# Patient Record
Sex: Female | Born: 1940 | Race: White | Hispanic: No | Smoking: Former smoker
Health system: Southern US, Community
[De-identification: ages and names within clinical notes are randomized; demographics above are authoritative.]

## PROBLEM LIST (undated history)

## (undated) DIAGNOSIS — C801 Malignant (primary) neoplasm, unspecified: Secondary | ICD-10-CM

## (undated) DIAGNOSIS — G894 Chronic pain syndrome: Secondary | ICD-10-CM

## (undated) DIAGNOSIS — E039 Hypothyroidism, unspecified: Secondary | ICD-10-CM

## (undated) DIAGNOSIS — M797 Fibromyalgia: Secondary | ICD-10-CM

## (undated) DIAGNOSIS — K219 Gastro-esophageal reflux disease without esophagitis: Secondary | ICD-10-CM

## (undated) DIAGNOSIS — G510 Bell's palsy: Secondary | ICD-10-CM

## (undated) DIAGNOSIS — Z8719 Personal history of other diseases of the digestive system: Secondary | ICD-10-CM

## (undated) DIAGNOSIS — E785 Hyperlipidemia, unspecified: Secondary | ICD-10-CM

## (undated) DIAGNOSIS — R011 Cardiac murmur, unspecified: Secondary | ICD-10-CM

## (undated) HISTORY — DX: Hypothyroidism, unspecified: E03.9

## (undated) HISTORY — PX: DILATION AND CURETTAGE OF UTERUS: SHX78

## (undated) HISTORY — PX: APPENDECTOMY: SHX54

## (undated) HISTORY — DX: Bell's palsy: G51.0

## (undated) HISTORY — DX: Chronic pain syndrome: G89.4

## (undated) HISTORY — PX: TONSILLECTOMY: SUR1361

## (undated) HISTORY — PX: OTHER SURGICAL HISTORY: SHX169

## (undated) HISTORY — DX: Hyperlipidemia, unspecified: E78.5

## (undated) HISTORY — DX: Fibromyalgia: M79.7

## (undated) HISTORY — PX: CHOLECYSTECTOMY: SHX55

---

## 2018-12-15 ENCOUNTER — Other Ambulatory Visit: Payer: Self-pay | Admitting: Internal Medicine

## 2018-12-15 ENCOUNTER — Other Ambulatory Visit: Payer: Self-pay

## 2018-12-15 DIAGNOSIS — Z20822 Contact with and (suspected) exposure to covid-19: Secondary | ICD-10-CM

## 2018-12-18 LAB — NOVEL CORONAVIRUS, NAA: SARS-CoV-2, NAA: DETECTED — AB

## 2019-01-04 ENCOUNTER — Other Ambulatory Visit: Payer: Self-pay

## 2019-01-04 ENCOUNTER — Other Ambulatory Visit: Payer: Medicare HMO

## 2019-01-04 DIAGNOSIS — Z20822 Contact with and (suspected) exposure to covid-19: Secondary | ICD-10-CM

## 2019-01-08 LAB — NOVEL CORONAVIRUS, NAA: SARS-CoV-2, NAA: NOT DETECTED

## 2019-12-13 ENCOUNTER — Other Ambulatory Visit (HOSPITAL_COMMUNITY): Payer: Self-pay | Admitting: Adult Health

## 2019-12-13 DIAGNOSIS — Z780 Asymptomatic menopausal state: Secondary | ICD-10-CM

## 2020-01-03 ENCOUNTER — Ambulatory Visit (HOSPITAL_COMMUNITY)
Admission: RE | Admit: 2020-01-03 | Discharge: 2020-01-03 | Disposition: A | Discharge status: Home / Self Care | Payer: Medicare HMO | Source: Ambulatory Visit | Attending: Adult Health | Admitting: Adult Health

## 2020-01-03 ENCOUNTER — Other Ambulatory Visit: Payer: Self-pay

## 2020-01-03 DIAGNOSIS — Z780 Asymptomatic menopausal state: Secondary | ICD-10-CM | POA: Diagnosis not present

## 2022-10-13 NOTE — Progress Notes (Unsigned)
GI Office Note    Referring Provider: Katherine Basset* Primary Care Physician:  Kara Pacer, NP  Primary Gastroenterologist:  Chief Complaint   No chief complaint on file.    History of Present Illness   Peggy Graves is a 82 y.o. female presenting today for dysphagia at the request of ***   Labs *** in epic and media Fibromyalgia,  Bell's palsy,  Hypothyroidism Chronic pain     Medications   No current outpatient medications on file.   No current facility-administered medications for this visit.    Allergies   Allergies as of 10/14/2022   (Not on File)    Past Medical History   No past medical history on file.  Past Surgical History   *** The histories are not reviewed yet. Please review them in the "History" navigator section and refresh this SmartLink.  Past Family History   No family history on file.  Past Social History   Social History   Socioeconomic History   Marital status: Divorced    Spouse name: Not on file   Number of children: Not on file   Years of education: Not on file   Highest education level: Not on file  Occupational History   Not on file  Tobacco Use   Smoking status: Not on file   Smokeless tobacco: Not on file  Substance and Sexual Activity   Alcohol use: Not on file   Drug use: Not on file   Sexual activity: Not on file  Other Topics Concern   Not on file  Social History Narrative   Not on file   Social Determinants of Health   Financial Resource Strain: Not on file  Food Insecurity: Not on file  Transportation Needs: Not on file  Physical Activity: Not on file  Stress: Not on file  Social Connections: Not on file  Intimate Partner Violence: Not on file    Review of Systems   General: Negative for anorexia, weight loss, fever, chills, fatigue, weakness. Eyes: Negative for vision changes.  ENT: Negative for hoarseness, difficulty swallowing , nasal congestion. CV:  Negative for chest pain, angina, palpitations, dyspnea on exertion, peripheral edema.  Respiratory: Negative for dyspnea at rest, dyspnea on exertion, cough, sputum, wheezing.  GI: See history of present illness. GU:  Negative for dysuria, hematuria, urinary incontinence, urinary frequency, nocturnal urination.  MS: Negative for joint pain, low back pain.  Derm: Negative for rash or itching.  Neuro: Negative for weakness, abnormal sensation, seizure, frequent headaches, memory loss,  confusion.  Psych: Negative for anxiety, depression, suicidal ideation, hallucinations.  Endo: Negative for unusual weight change.  Heme: Negative for bruising or bleeding. Allergy: Negative for rash or hives.  Physical Exam   There were no vitals taken for this visit.   General: Well-nourished, well-developed in no acute distress.  Head: Normocephalic, atraumatic.   Eyes: Conjunctiva pink, no icterus. Mouth: Oropharyngeal mucosa moist and pink , no lesions erythema or exudate. Neck: Supple without thyromegaly, masses, or lymphadenopathy.  Lungs: Clear to auscultation bilaterally.  Heart: Regular rate and rhythm, no murmurs rubs or gallops.  Abdomen: Bowel sounds are normal, nontender, nondistended, no hepatosplenomegaly or masses,  no abdominal bruits or hernia, no rebound or guarding.   Rectal: *** Extremities: No lower extremity edema. No clubbing or deformities.  Neuro: Alert and oriented x 4 , grossly normal neurologically.  Skin: Warm and dry, no rash or jaundice.   Psych: Alert and cooperative, normal mood  and affect.  Labs   *** Imaging Studies   No results found.  Assessment       PLAN   ***   Leanna Battles. Melvyn Neth, MHS, PA-C [facility]

## 2022-10-14 ENCOUNTER — Encounter: Payer: Self-pay | Admitting: *Deleted

## 2022-10-14 ENCOUNTER — Telehealth: Payer: Self-pay | Admitting: *Deleted

## 2022-10-14 ENCOUNTER — Ambulatory Visit: Payer: Medicare PPO | Admitting: Gastroenterology

## 2022-10-14 ENCOUNTER — Encounter: Payer: Self-pay | Admitting: Gastroenterology

## 2022-10-14 VITALS — BP 167/65 | HR 73 | Temp 98.2°F | Ht 62.0 in | Wt 166.4 lb

## 2022-10-14 DIAGNOSIS — R1310 Dysphagia, unspecified: Secondary | ICD-10-CM | POA: Insufficient documentation

## 2022-10-14 DIAGNOSIS — R490 Dysphonia: Secondary | ICD-10-CM

## 2022-10-14 DIAGNOSIS — R1319 Other dysphagia: Secondary | ICD-10-CM

## 2022-10-14 NOTE — Telephone Encounter (Signed)
[employer] PA: Approved Authorization #[id number]  Tracking #[id number] Dates of service 11/13/2022 - 02/12/2023

## 2022-10-14 NOTE — Patient Instructions (Signed)
Upper endoscopy to be scheduled to evaluate your esophagus. 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  Please follow up with your PCP regarding elevated blood pressure above 140/90.

## 2022-10-15 ENCOUNTER — Other Ambulatory Visit: Payer: Self-pay | Admitting: *Deleted

## 2022-10-15 ENCOUNTER — Encounter: Payer: Self-pay | Admitting: Gastroenterology

## 2022-10-15 ENCOUNTER — Telehealth: Payer: Self-pay | Admitting: Gastroenterology

## 2022-10-15 DIAGNOSIS — R490 Dysphonia: Secondary | ICD-10-CM

## 2022-10-15 NOTE — Telephone Encounter (Signed)
I faxed the referral along with notes from [facility].  I had to put the referral in epic to show she was referred.

## 2022-10-15 NOTE — Telephone Encounter (Signed)
Tammy, can you make sure that ENT is aware that the reason for referral is not just hoarseness, she also has an abnormal right vocal cord seen on CT that needs direct visualization. It was not clear of this on the referral order. Thanks!

## 2022-11-11 ENCOUNTER — Other Ambulatory Visit: Payer: Self-pay

## 2022-11-11 ENCOUNTER — Encounter (HOSPITAL_COMMUNITY)
Admission: RE | Admit: 2022-11-11 | Discharge: 2022-11-11 | Disposition: A | Discharge status: Home / Self Care | Payer: Medicare PPO | Source: Ambulatory Visit | Attending: Internal Medicine | Admitting: Internal Medicine

## 2022-11-11 ENCOUNTER — Encounter (HOSPITAL_COMMUNITY): Payer: Self-pay

## 2022-11-11 DIAGNOSIS — R9431 Abnormal electrocardiogram [ECG] [EKG]: Secondary | ICD-10-CM | POA: Diagnosis not present

## 2022-11-11 DIAGNOSIS — I517 Cardiomegaly: Secondary | ICD-10-CM | POA: Insufficient documentation

## 2022-11-11 DIAGNOSIS — Z01810 Encounter for preprocedural cardiovascular examination: Secondary | ICD-10-CM | POA: Insufficient documentation

## 2022-11-11 HISTORY — DX: Cardiac murmur, unspecified: R01.1

## 2022-11-11 LAB — NO BLOOD PRODUCTS

## 2022-11-11 NOTE — Patient Instructions (Addendum)
Peggy Graves  11/11/2022     @PREFPERIOPPHARMACY @   Your procedure is scheduled on 11/13/2022.  Report to [facility] at 10:30 [facility].M.  Call this number if you have problems the morning of surgery:  [phone]  If you experience any cold or flu symptoms such as cough, fever, chills, shortness of breath, etc. between now and your scheduled surgery, please notify us at the above number.   Remember:   Please Follow the diet and prep instructions given to you by Dr Luvenia Starch office.      Take these medicines the morning of surgery with A SIP OF WATER : Hydrocodone Levothyroxine Meloxicam Omeprazole Ditropan  Do not wear jewelry, make-up or nail polish.  Do not wear lotions, powders, or perfumes, or deodorant.  Do not shave 48 hours prior to surgery.  Men may shave face and neck.  Do not bring valuables to the hospital.  [facility] is not responsible for any belongings or valuables.  Contacts, dentures or bridgework may not be worn into surgery.  Leave your suitcase in the car.  After surgery it may be brought to your room.  For patients admitted to the hospital, discharge time will be determined by your treatment team.  Patients discharged the day of surgery will not be allowed to drive home.   Name and phone number of your driver:   Family Special instructions:  N/A  Please read over the following fact sheets that you were given. Care and Recovery After Surgery  Upper Endoscopy, Adult Upper endoscopy is a procedure to look inside the upper GI (gastrointestinal) tract. The upper GI tract is made up of: The esophagus. This is the part of the body that moves food from your mouth to your stomach. The stomach. The duodenum. This is the first part of your small intestine. This procedure is also called esophagogastroduodenoscopy (EGD) or gastroscopy. In this procedure, your health care provider passes a thin, flexible tube (endoscope) through your mouth and down your esophagus into  your stomach and into your duodenum. A small camera is attached to the end of the tube. Images from the camera appear on a monitor in the exam room. During this procedure, your health care provider may also remove a small piece of tissue to be sent to a lab and examined under a microscope (biopsy). Your health care provider may do an upper endoscopy to diagnose cancers of the upper GI tract. You may also have this procedure to find the cause of other conditions, such as: Stomach pain. Heartburn. Pain or problems when swallowing. Nausea and vomiting. Stomach bleeding. Stomach ulcers. Tell a health care provider about: Any allergies you have. All medicines you are taking, including vitamins, herbs, eye drops, creams, and over-the-counter medicines. Any problems you or family members have had with anesthetic medicines. Any bleeding problems you have. Any surgeries you have had. Any medical conditions you have. Whether you are pregnant or may be pregnant. What are the risks? Your healthcare provider will talk with you about risks. These may include: Infection. Bleeding. Allergic reactions to medicines. A tear or hole (perforation) in the esophagus, stomach, or duodenum. What happens before the procedure? When to stop eating and drinking Follow instructions from your health care provider about what you may eat and drink. These may include: 8 hours before your procedure Stop eating most foods. Do not eat meat, fried foods, or fatty foods. Eat only light foods, such as toast or crackers. All liquids are okay except  energy drinks and alcohol. 6 hours before your procedure Stop eating. Drink only clear liquids, such as water, clear fruit juice, black coffee, plain tea, and sports drinks. Do not drink energy drinks or alcohol. 2 hours before your procedure Stop drinking all liquids. You may be allowed to take medicines with small sips of water. If you do not follow your health care  provider's instructions, your procedure may be delayed or canceled. Medicines Ask your health care provider about: Changing or stopping your regular medicines. This is especially important if you are taking diabetes medicines or blood thinners. Taking medicines such as aspirin and ibuprofen. These medicines can thin your blood. Do not take these medicines unless your health care provider tells you to take them. Taking over-the-counter medicines, vitamins, herbs, and supplements. General instructions If you will be going home right after the procedure, plan to have a responsible adult: Take you home from the hospital or clinic. You will not be allowed to drive. Care for you for the time you are told. What happens during the procedure?  An IV will be inserted into one of your veins. You may be given one or more of the following: A medicine to help you relax (sedative). A medicine to numb the throat (local anesthetic). You will lie on your left side on an exam table. Your health care provider will pass the endoscope through your mouth and down your esophagus. Your health care provider will use the scope to check the inside of your esophagus, stomach, and duodenum. Biopsies may be taken. The endoscope will be removed. The procedure may vary among health care providers and hospitals. What happens after the procedure? Your blood pressure, heart rate, breathing rate, and blood oxygen level will be monitored until you leave the hospital or clinic. When your throat is no longer numb, you may be given some fluids to drink. If you were given a sedative during the procedure, it can affect you for several hours. Do not drive or operate machinery until your health care provider says that it is safe. It is up to you to get the results of your procedure. Ask your health care provider, or the department that is doing the procedure, when your results will be ready. Contact a health care provider if  you: Have a sore throat that lasts longer than 1 day. Have a fever. Get help right away if you: Vomit blood or your vomit looks like coffee grounds. Have bloody, black, or tarry stools. Have a very bad sore throat or you cannot swallow. Have difficulty breathing or very bad pain in your chest or abdomen. These symptoms may be an emergency. Get help right away. Call 911. Do not wait to see if the symptoms will go away. Do not drive yourself to the hospital. Summary Upper endoscopy is a procedure to look inside the upper GI tract. During the procedure, an IV will be inserted into one of your veins. You may be given a medicine to help you relax. The endoscope will be passed through your mouth and down your esophagus. Follow instructions from your health care provider about what you can eat and drink. This information is not intended to replace advice given to you by your health care provider. Make sure you discuss any questions you have with your health care provider. Document Revised: 09/19/2021 Document Reviewed: 09/19/2021 Elsevier Patient Education  2023 [employer]  Monitored Anesthesia Care Anesthesia refers to the techniques, procedures, and medicines that help a person stay  safe and comfortable during surgery. Monitored anesthesia care, or sedation, is one type of anesthesia. You may have sedation if you do not need to be asleep for your procedure. Procedures that use sedation may include: Surgery to remove cataracts from your eyes. A dental procedure. A biopsy. This is when a tissue sample is removed and looked at under a microscope. You will be watched closely during your procedure. Your level of sedation or type of anesthesia may be changed to fit your needs. Tell a health care provider about: Any allergies you have. All medicines you are taking, including vitamins, herbs, eye drops, creams, and over-the-counter medicines. Any problems you or family members have had with  anesthesia. Any bleeding problems you have. Any surgeries you have had. Any medical conditions or illnesses you have. This includes sleep apnea, cough, fever, or the flu. Whether you are pregnant or may be pregnant. Whether you use cigarettes, alcohol, or drugs. Any use of steroids, whether by mouth or as a cream. What are the risks? Your health care provider will talk with you about risks. These may include: Getting too much medicine (oversedation). Nausea. Allergic reactions to medicines. Trouble breathing. If this happens, a breathing tube may be used to help you breathe. It will be removed when you are awake and breathing on your own. Heart trouble. Lung trouble. Confusion that gets better with time (emergence delirium). What happens before the procedure? When to stop eating and drinking Follow instructions from your health care provider about what you may eat and drink. These may include: 8 hours before your procedure Stop eating most foods. Do not eat meat, fried foods, or fatty foods. Eat only light foods, such as toast or crackers. All liquids are okay except energy drinks and alcohol. 6 hours before your procedure Stop eating. Drink only clear liquids, such as water, clear fruit juice, black coffee, plain tea, and sports drinks. Do not drink energy drinks or alcohol. 2 hours before your procedure Stop drinking all liquids. You may be allowed to take medicines with small sips of water. If you do not follow your health care provider's instructions, your procedure may be delayed or canceled. Medicines Ask your health care provider about: Changing or stopping your regular medicines. These include any diabetes medicines or blood thinners you take. Taking medicines such as aspirin and ibuprofen. These medicines can thin your blood. Do not take them unless your health care provider tells you to. Taking over-the-counter medicines, vitamins, herbs, and supplements. Testing You  may have an exam or testing. You may have a blood or urine sample taken. General instructions Do not use any products that contain nicotine or tobacco for at least 4 weeks before the procedure. These products include cigarettes, chewing tobacco, and vaping devices, such as e-cigarettes. If you need help quitting, ask your health care provider. If you will be going home right after the procedure, plan to have a responsible adult: Take you home from the hospital or clinic. You will not be allowed to drive. Care for you for the time you are told. What happens during the procedure?  Your blood pressure, heart rate, breathing, level of pain, and blood oxygen level will be monitored. An IV will be inserted into one of your veins. You may be given: A sedative. This helps you relax. Anesthesia. This will: Numb certain areas of your body. Make you fall asleep for surgery. You will be given medicines as needed to keep you comfortable. The more medicine you are  given, the deeper your level of sedation will be. Your level of sedation may be changed to fit your needs. There are three levels of sedation: Mild sedation. At this level, you may feel awake and relaxed. You will be able to follow directions. Moderate sedation. At this level, you will be sleepy. You may not remember the procedure. Deep sedation. At this level, you will be asleep. You will not remember the procedure. How you get the medicines will depend on your age and the procedure. They may be given as: A pill. This may be taken by mouth (orally) or inserted into the rectum. An injection. This may be into a vein or muscle. A spray through the nose. After your procedure is over, the medicine will be stopped. The procedure may vary among health care providers and hospitals. What happens after the procedure? Your blood pressure, heart rate, breathing rate, and blood oxygen level will be monitored until you leave the hospital or clinic. You  may feel sleepy, clumsy, or nauseous. You may not remember what happened during or after the procedure. Sedation can affect you for several hours. Do not drive or use machinery until your health care provider says that it is safe. This information is not intended to replace advice given to you by your health care provider. Make sure you discuss any questions you have with your health care provider. Document Revised: 11/05/2021 Document Reviewed: 11/05/2021 Elsevier Patient Education  2023 [employer].

## 2022-11-13 ENCOUNTER — Other Ambulatory Visit: Payer: Self-pay | Admitting: *Deleted

## 2022-11-13 ENCOUNTER — Ambulatory Visit (HOSPITAL_COMMUNITY)
Admission: RE | Admit: 2022-11-13 | Discharge: 2022-11-13 | Disposition: A | Discharge status: Home / Self Care | Payer: Medicare PPO | Attending: Internal Medicine | Admitting: Internal Medicine

## 2022-11-13 ENCOUNTER — Encounter (HOSPITAL_COMMUNITY): Payer: Self-pay | Admitting: Internal Medicine

## 2022-11-13 ENCOUNTER — Telehealth: Payer: Self-pay

## 2022-11-13 ENCOUNTER — Encounter (HOSPITAL_COMMUNITY)
Admission: RE | Disposition: A | Discharge status: Home / Self Care | Payer: Self-pay | Source: Home / Self Care | Attending: Internal Medicine

## 2022-11-13 ENCOUNTER — Ambulatory Visit (HOSPITAL_COMMUNITY): Payer: Medicare PPO | Admitting: Anesthesiology

## 2022-11-13 ENCOUNTER — Ambulatory Visit (HOSPITAL_BASED_OUTPATIENT_CLINIC_OR_DEPARTMENT_OTHER): Payer: Medicare PPO | Admitting: Anesthesiology

## 2022-11-13 DIAGNOSIS — K319 Disease of stomach and duodenum, unspecified: Secondary | ICD-10-CM | POA: Insufficient documentation

## 2022-11-13 DIAGNOSIS — K222 Esophageal obstruction: Secondary | ICD-10-CM

## 2022-11-13 DIAGNOSIS — Z79899 Other long term (current) drug therapy: Secondary | ICD-10-CM | POA: Insufficient documentation

## 2022-11-13 DIAGNOSIS — K2210 Ulcer of esophagus without bleeding: Secondary | ICD-10-CM | POA: Insufficient documentation

## 2022-11-13 DIAGNOSIS — E039 Hypothyroidism, unspecified: Secondary | ICD-10-CM | POA: Diagnosis not present

## 2022-11-13 DIAGNOSIS — K2100 Gastro-esophageal reflux disease with esophagitis, without bleeding: Secondary | ICD-10-CM

## 2022-11-13 DIAGNOSIS — R1314 Dysphagia, pharyngoesophageal phase: Secondary | ICD-10-CM | POA: Insufficient documentation

## 2022-11-13 DIAGNOSIS — K259 Gastric ulcer, unspecified as acute or chronic, without hemorrhage or perforation: Secondary | ICD-10-CM | POA: Diagnosis not present

## 2022-11-13 DIAGNOSIS — R490 Dysphonia: Secondary | ICD-10-CM

## 2022-11-13 DIAGNOSIS — K449 Diaphragmatic hernia without obstruction or gangrene: Secondary | ICD-10-CM

## 2022-11-13 DIAGNOSIS — Z87891 Personal history of nicotine dependence: Secondary | ICD-10-CM

## 2022-11-13 DIAGNOSIS — J988 Other specified respiratory disorders: Secondary | ICD-10-CM | POA: Diagnosis not present

## 2022-11-13 DIAGNOSIS — R1319 Other dysphagia: Secondary | ICD-10-CM

## 2022-11-13 DIAGNOSIS — M797 Fibromyalgia: Secondary | ICD-10-CM | POA: Diagnosis not present

## 2022-11-13 DIAGNOSIS — R1310 Dysphagia, unspecified: Secondary | ICD-10-CM

## 2022-11-13 HISTORY — PX: MALONEY DILATION: SHX5535

## 2022-11-13 HISTORY — PX: ESOPHAGOGASTRODUODENOSCOPY (EGD) WITH PROPOFOL: SHX5813

## 2022-11-13 SURGERY — ESOPHAGOGASTRODUODENOSCOPY (EGD) WITH PROPOFOL
Anesthesia: General

## 2022-11-13 MED ORDER — PROPOFOL 10 MG/ML IV BOLUS
INTRAVENOUS | Status: DC | PRN
  Administered 2022-11-13: 70 mg via INTRAVENOUS
  Administered 2022-11-13: 25 mg via INTRAVENOUS
  Administered 2022-11-13: 30 mg via INTRAVENOUS
  Administered 2022-11-13: 25 mg via INTRAVENOUS

## 2022-11-13 MED ORDER — LACTATED RINGERS IV SOLN
INTRAVENOUS | Status: DC
  Administered 2022-11-13: 1000 mL via INTRAVENOUS

## 2022-11-13 MED ORDER — OMEPRAZOLE 40 MG PO CPDR: 40.0000 mg | DELAYED_RELEASE_CAPSULE | Freq: Every day | ORAL | 11 refills | Status: DC

## 2022-11-13 MED ORDER — LACTATED RINGERS IV SOLN: INTRAVENOUS | Status: DC | PRN

## 2022-11-13 MED ORDER — PHENYLEPHRINE HCL (PRESSORS) 10 MG/ML IV SOLN
INTRAVENOUS | Status: DC | PRN
  Administered 2022-11-13: 80 ug via INTRAVENOUS

## 2022-11-13 MED ORDER — LIDOCAINE HCL (CARDIAC) PF 100 MG/5ML IV SOSY
PREFILLED_SYRINGE | INTRAVENOUS | Status: DC | PRN
  Administered 2022-11-13: 100 mg via INTRATRACHEAL

## 2022-11-13 NOTE — Anesthesia Preprocedure Evaluation (Addendum)
Anesthesia Evaluation  Patient identified by MRN, date of birth, ID band Patient awake    Reviewed: Allergy & Precautions, H&P , NPO status , Patient's Chart, lab work & pertinent test results  Airway Mallampati: II  TM Distance: >3 FB Neck ROM: Full    Dental  (+) Edentulous Upper, Edentulous Lower   Pulmonary Patient abstained from smoking., former smoker   Pulmonary exam normal breath sounds clear to auscultation       Cardiovascular Exercise Tolerance: Good Normal cardiovascular exam+ Valvular Problems/Murmurs  Rhythm:Regular Rate:Normal     Neuro/Psych  Neuromuscular disease  negative psych ROS   GI/Hepatic negative GI ROS, Neg liver ROS,,,  Endo/Other  Hypothyroidism    Renal/GU negative Renal ROS  negative genitourinary   Musculoskeletal  (+)  Fibromyalgia -  Abdominal   Peds negative pediatric ROS (+)  Hematology negative hematology ROS (+)   Anesthesia Other Findings   Reproductive/Obstetrics negative OB ROS                             Anesthesia Physical Anesthesia Plan  ASA: 2  Anesthesia Plan: General   Post-op Pain Management: Minimal or no pain anticipated   Induction: Intravenous  PONV Risk Score and Plan: Propofol infusion  Airway Management Planned: Nasal Cannula and Natural Airway  Additional Equipment:   Intra-op Plan:   Post-operative Plan:   Informed Consent: I have reviewed the patients History and Physical, chart, labs and discussed the procedure including the risks, benefits and alternatives for the proposed anesthesia with the patient or authorized representative who has indicated his/her understanding and acceptance.     Dental advisory given  Plan Discussed with: CRNA and Surgeon  Anesthesia Plan Comments:        Anesthesia Quick Evaluation

## 2022-11-13 NOTE — Op Note (Signed)
[facility] Patient Name: Peggy Graves Procedure Date: 11/13/2022 11:46 AM MRN: [id number] Date of Birth: 03-Nov-1940 Attending MD: Gennette Pac , MD, [id number] CSN: [id number] Age: 82 Admit Type: Outpatient Procedure:                Upper GI endoscopy Indications:              Dysphagia Providers:                Gennette Pac, MD, Buel Ream. Thomasena Edis RN, RN,                            Lennice Sites Technician, Technician Referring MD:              Medicines:                Propofol per Anesthesia Complications:            No immediate complications. Estimated Blood Loss:     Estimated blood loss was minimal. Procedure:                Pre-Anesthesia Assessment:                           - Prior to the procedure, a History and Physical                            was performed, and patient medications and                            allergies were reviewed. The patient's tolerance of                            previous anesthesia was also reviewed. The risks                            and benefits of the procedure and the sedation                            options and risks were discussed with the patient.                            All questions were answered, and informed consent                            was obtained. Prior Anticoagulants: The patient has                            taken no anticoagulant or antiplatelet agents. ASA                            Grade Assessment: III - A patient with severe                            systemic disease. After reviewing the risks and  benefits, the patient was deemed in satisfactory                            condition to undergo the procedure.                           After obtaining informed consent, the endoscope was                            passed under direct vision. Throughout the                            procedure, the patient's blood pressure, pulse, and                             oxygen saturations were monitored continuously. The                            [id number]-[id number] ([id number]) scope was introduced through the                            mouth, and advanced to the second part of duodenum.                            The upper GI endoscopy was accomplished without                            difficulty. The patient tolerated the procedure                            well. 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 See photos.      A mild Schatzki ring was found at the gastroesophageal junction. Patient       had associated circumferential erosions extending for 3 cm into the       distal esophagus. No Barrett's epithelium seen. No tumor seen. The scope       was withdrawn. Dilation was performed with a Maloney dilator with mild       resistance at 54 Fr. The dilation site was examined following endoscope       reinsertion and showed mild mucosal disruption. Estimated blood loss was       minimal.      A small hiatal hernia was present. Multiple gastric/antral erosions. No       frank ulcer or infiltrating process seen. Patent pylorus.      The duodenal bulb and second portion of the duodenum were normal. Impression:               - Mild Schatzki ring. Dilated. Erosive reflux                            esophagitis.                           -  Small hiatal hernia. Gastric erosions. Status                            post gastric biopsy                           - Normal duodenal bulb and second portion of the                            duodenum.                           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 Moderate Sedation:      Moderate (conscious) sedation was personally administered by an       anesthesia  professional. The following parameters were monitored: oxygen       saturation, heart rate, blood pressure, respiratory rate, EKG, adequacy       of pulmonary ventilation, and response to care. Recommendation:           - Patient has a contact number available for                            emergencies. The signs and symptoms of potential                            delayed complications were discussed with the                            patient. Return to normal activities tomorrow.                            Written discharge instructions were provided to the                            patient.                           - Advance diet as tolerated. Increase omeprazole to                            40 mg once daily. New prescription provided to her                            pharmacy. She is to take 40 mg 30 minutes before                            breakfast every day without fail. Follow-up on                            pathology.                           -Urgent ENT  consultation. I discussed the urgency                            of this matter with patient's Sister Danne Baxter at                            [phone].                           -Office follow-up with [place] in 3 months Procedure Code(s):        --- Professional ---                           [id number], Esophagogastroduodenoscopy, flexible,                            transoral; diagnostic, including collection of                            specimen(s) by brushing or washing, when performed                            (separate procedure)                           43450, Dilation of esophagus, by unguided sound or                            bougie, single or multiple passes Diagnosis Code(s):        --- Professional ---                           K22.2, Esophageal obstruction                           K44.9, Diaphragmatic hernia without obstruction or                            gangrene                           R13.10,  Dysphagia, unspecified CPT copyright 2022 American [employer] Association. All rights reserved. The codes documented in this report are preliminary and upon coder review may  be revised to meet current compliance requirements. Gerrit Friends. Kindra Bickham, MD Gennette Pac, MD 11/13/2022 1:10:08 PM This report has been signed electronically. Number of Addenda: 0

## 2022-11-13 NOTE — H&P (Signed)
@LOGO @   Primary Care Physician:  Kara Pacer, NP Primary Gastroenterologist:  Dr. Jena Gauss  Pre-Procedure History & Physical: HPI:  Peggy Graves is a 82 y.o. female here for   Further evaluation of chronic esophageal dysphagia to food and pills.  Takes omeprazole occasionally for reflux.  Past Medical History:  Diagnosis Date   Bell's palsy    Chronic pain syndrome    Fibromyalgia    Heart murmur    Hyperlipidemia    Hypothyroidism     Past Surgical History:  Procedure Laterality Date   APPENDECTOMY     CHOLECYSTECTOMY     DILATION AND CURETTAGE OF UTERUS     multiple   hysterectomy     partial and then completed   TONSILLECTOMY      Prior to Admission medications   Medication Sig Start Date End Date Taking? Authorizing Provider  aspirin 325 MG tablet Take 1 tablet by mouth daily. 09/18/22 10/18/23 Yes [provider]  Cholecalciferol (VITAMIN D) 50 MCG (2000 UT) tablet Take 2,000 Units by mouth daily.   Yes [provider]  Cyanocobalamin (B-12) 2500 MCG TABS Take 2,500 mcg by mouth daily.   Yes [provider]  ezetimibe (ZETIA) 10 MG tablet Take 10 mg by mouth daily. 09/02/22  Yes [provider]  furosemide (LASIX) 20 MG tablet Take 10 mg by mouth daily. 09/09/22  Yes [provider]  HYDROcodone-acetaminophen (NORCO/VICODIN) 5-325 MG tablet Take 1 tablet by mouth every 6 (six) hours as needed for moderate pain. 09/02/22  Yes [provider]  levothyroxine (SYNTHROID) 100 MCG tablet Take 100 mcg by mouth daily before breakfast. 09/02/22  Yes [provider]  meloxicam (MOBIC) 7.5 MG tablet Take 7.5 mg by mouth daily. 09/09/22  Yes [provider]  omeprazole (PRILOSEC) 20 MG capsule Take 20 mg by mouth daily as needed (acid reflux).   Yes [provider]  OVER THE COUNTER MEDICATION Take 1 tablet by mouth daily. Del-immune v supplement   Yes [provider]  oxybutynin  (DITROPAN) 5 MG tablet Take 5 mg by mouth daily.   Yes [provider]  EPINEPHrine (EPIPEN 2-PAK) 0.3 mg/0.3 mL IJ SOAJ injection Inject 0.3 mg into the muscle as needed for anaphylaxis.    [provider]    Allergies as of 10/14/2022 - Review Complete 10/14/2022  Allergen Reaction Noted   Bee venom Anaphylaxis 09/17/2022   Nicorette [nicotine polacrilex] Anaphylaxis 10/14/2022   Penicillin g Anaphylaxis 09/17/2022   Sulfur Anaphylaxis 09/17/2022    Family History  Problem Relation Age of Onset   Colon cancer Neg Hx     Social History   Socioeconomic History   Marital status: Divorced    Spouse name: Not on file   Number of children: Not on file   Years of education: Not on file   Highest education level: Not on file  Occupational History   Not on file  Tobacco Use   Smoking status: Former    Types: Cigarettes    Quit date: 06/23/2018    Years since quitting: 4.3   Smokeless tobacco: Never  Substance and Sexual Activity   Alcohol use: Not Currently    Comment: no more than twice per year   Drug use: Never   Sexual activity: Not on file  Other Topics Concern   Not on file  Social History Narrative   Not on file   Social Determinants of Health   Financial Resource Strain: Not on  file  Food Insecurity: Not on file  Transportation Needs: Not on file  Physical Activity: Not on file  Stress: Not on file  Social Connections: Not on file  Intimate Partner Violence: Not on file    Review of Systems: See HPI, otherwise negative ROS  Physical Exam: BP (!) 205/76   Temp 98.4 F (36.9 C) (Oral)   Resp 17   Ht 5\' 2"  (1.575 m)   Wt 72.6 kg   SpO2 97%   BMI 29.26 kg/m  General:   Alert,  Well-developed, well-nourished, pleasant and cooperative in NAD Neck:  Supple; no masses or thyromegaly. No significant cervical adenopathy. Lungs:  Clear throughout to auscultation.   No wheezes, crackles, or rhonchi. No acute distress. Heart:  Regular rate  and rhythm; no murmurs, clicks, rubs,  or gallops. Abdomen: Non-distended, normal bowel sounds.  Soft and nontender without appreciable mass or hepatosplenomegaly.   Impression/Plan:    82 year old lady with chronic esophageal dysphagia.  Here for further evaluation via EGD.  I have offered the patient EGD with esophageal dilation as feasible/appropriate per plan.  The risks, benefits, limitations, alternatives and imponderables have been reviewed with the patient. Potential for esophageal dilation, biopsy, etc. have also been reviewed.  Questions have been answered. All parties agreeable.      Notice: This dictation was prepared with Dragon dictation along with smaller phrase technology. Any transcriptional errors that result from this process are unintentional and may not be corrected upon review.

## 2022-11-13 NOTE — Telephone Encounter (Signed)
-----   Message from Corbin Ade, MD sent at 11/13/2022  1:02 PM EDT -----   Need new prescription for omeprazole 40 mg pill take 1 every day 30 minutes before breakfast.  Dispense 30 with 11 refills.  She is to stop omeprazole 20 mg daily.

## 2022-11-13 NOTE — Progress Notes (Signed)
Per [facility] referral was not received. I have refaxed again.

## 2022-11-13 NOTE — Discharge Instructions (Signed)
EGD Discharge instructions Please read the instructions outlined below and refer to this sheet in the next few weeks. These discharge instructions provide you with general information on caring for yourself after you leave the hospital. Your doctor may also give you specific instructions. While your treatment has been planned according to the most current medical practices available, unavoidable complications occasionally occur. If you have any problems or questions after discharge, please call your doctor. ACTIVITY You may resume your regular activity but move at a slower pace for the next 24 hours.  Take frequent rest periods for the next 24 hours.  Walking will help expel (get rid of) the air and reduce the bloated feeling in your abdomen.  No driving for 24 hours (because of the anesthesia (medicine) used during the test).  You may shower.  Do not sign any important legal documents or operate any machinery for 24 hours (because of the anesthesia used during the test).  NUTRITION Drink plenty of fluids.  You may resume your normal diet.  Begin with a light meal and progress to your normal diet.  Avoid alcoholic beverages for 24 hours or as instructed by your caregiver.  MEDICATIONS You may resume your normal medications unless your caregiver tells you otherwise.  WHAT YOU CAN EXPECT TODAY You may experience abdominal discomfort such as a feeling of fullness or "gas" pains.  FOLLOW-UP Your doctor will discuss the results of your test with you.  SEEK IMMEDIATE MEDICAL ATTENTION IF ANY OF THE FOLLOWING OCCUR: Excessive nausea (feeling sick to your stomach) and/or vomiting.  Severe abdominal pain and distention (swelling).  Trouble swallowing.  Temperature over 101 F (37.8 C).  Rectal bleeding or vomiting of blood.      you have a cyst in your airway that flops back-and-forth across to your vocal cords -  probably making it difficult to breathe from time to time.  You need to see an  ear nose and throat specialist is soon as possible   you also have prominent acid burns in your esophagus and a narrowing in your esophagus.  Your esophagus was stretched.  You have an inflamed stomach and biopsies were taken.  Not only do you need to take your omeprazole every day you need to take a higher dose  We are calling in a new prescription for  omeprazole 40 mg pill to be taken every day 30 minutes before breakfast.   My office is  calling a  new prescription to your pharmacy   office visit with [place] in 3 months.

## 2022-11-13 NOTE — Transfer of Care (Signed)
Immediate Anesthesia Transfer of Care Note  Patient: Peggy Graves  Procedure(s) Performed: ESOPHAGOGASTRODUODENOSCOPY (EGD) WITH PROPOFOL MALONEY DILATION  Patient Location: PACU  Anesthesia Type:General  Level of Consciousness: awake, alert , oriented, and patient cooperative  Airway & Oxygen Therapy: Patient Spontanous Breathing and Patient connected to nasal cannula oxygen  Post-op Assessment: Report given to RN, Post -op Vital signs reviewed and stable, and Patient moving all extremities X 4  Post vital signs: Reviewed and stable  Last Vitals:  Vitals Value Taken Time  BP 153/44 11/13/22 1301  Temp 36.7 C 11/13/22 1301  Pulse 85 11/13/22 1305  Resp 22 11/13/22 1305  SpO2 92 % 11/13/22 1305  Vitals shown include unvalidated device data.  Last Pain:  Vitals:   11/13/22 1301  TempSrc:   PainSc: 0-No pain      Patients Stated Pain Goal: 7 (11/13/22 1047)  Complications: No notable events documented.

## 2022-11-13 NOTE — Anesthesia Postprocedure Evaluation (Signed)
Anesthesia Post Note  Patient: Peggy Graves  Procedure(s) Performed: ESOPHAGOGASTRODUODENOSCOPY (EGD) WITH PROPOFOL MALONEY DILATION  Patient location during evaluation: PACU Anesthesia Type: General Level of consciousness: awake and alert and oriented Pain management: pain level controlled Vital Signs Assessment: post-procedure vital signs reviewed and stable Respiratory status: spontaneous breathing, nonlabored ventilation and respiratory function stable Cardiovascular status: blood pressure returned to baseline and stable Postop Assessment: no apparent nausea or vomiting Anesthetic complications: no  No notable events documented.   Last Vitals:  Vitals:   11/13/22 1345 11/13/22 1354  BP: (!) 197/59 (!) 154/99  Pulse:  69  Resp:  17  Temp:  36.8 C  SpO2:  97%    Last Pain:  Vitals:   11/13/22 1354  TempSrc: Oral  PainSc: 0-No pain                 Tenise Stetler C Brit Wernette

## 2022-11-13 NOTE — Telephone Encounter (Signed)
Sent to pharmacy on file 

## 2022-11-15 LAB — SURGICAL PATHOLOGY

## 2022-11-20 ENCOUNTER — Encounter (HOSPITAL_COMMUNITY): Payer: Self-pay | Admitting: Internal Medicine

## 2022-11-21 ENCOUNTER — Encounter: Payer: Self-pay | Admitting: Internal Medicine

## 2023-04-25 ENCOUNTER — Other Ambulatory Visit: Payer: Self-pay | Admitting: *Deleted

## 2023-04-25 ENCOUNTER — Telehealth: Payer: Self-pay | Admitting: *Deleted

## 2023-04-25 NOTE — Telephone Encounter (Signed)
[facility]    Message  Received: Clarene Duke, MD  Elinor Dodge, LPN I do not recall given her a specific ENT doctor name.  There a group of good ENT doctors in [place] [facility].  Lets get her referred there.  They need to see photos of the EGD.  Send report.       Previous Messages    ----- Message ----- From: Elinor Dodge, LPN Sent: 16/03/9603   4:39 PM EDT To: Corbin Ade, MD  Pt called back wanting to know the name of the doctor ----- Message ----- From: Elinor Dodge, LPN Sent: 54/02/8118   1:57 PM EDT To: Corbin Ade, MD  Pt called and said when she had her EGD that you recommended she see doctor about the issue she was having with her throat. She says she had the name and number of the provider but has missed placed it.

## 2023-04-25 NOTE — Progress Notes (Signed)
error 

## 2023-04-25 NOTE — Telephone Encounter (Signed)
Pt was referred twice to Dr. Aleene Davidson. She hasn't heard anything from them. Pt was given phone number to contact his office and if they have any issues she will call us back

## 2023-06-02 ENCOUNTER — Telehealth: Payer: Self-pay | Admitting: Otolaryngology

## 2023-06-02 NOTE — Telephone Encounter (Signed)
Called pt to try and sch her this week. Pt was a referral message to Dr Irene Pap. Pt has a referral in the system from back in May 2024. When We reach the pt we can reopen the referral and document. Lvmail for pt to call office

## 2023-06-03 ENCOUNTER — Other Ambulatory Visit (HOSPITAL_COMMUNITY): Payer: Self-pay | Admitting: Otolaryngology

## 2023-06-03 DIAGNOSIS — J387 Other diseases of larynx: Secondary | ICD-10-CM

## 2023-06-09 ENCOUNTER — Ambulatory Visit (INDEPENDENT_AMBULATORY_CARE_PROVIDER_SITE_OTHER): Payer: Medicare PPO | Admitting: Otolaryngology

## 2023-06-09 ENCOUNTER — Encounter (INDEPENDENT_AMBULATORY_CARE_PROVIDER_SITE_OTHER): Payer: Self-pay | Admitting: Otolaryngology

## 2023-06-09 VITALS — BP 186/61 | HR 83 | Ht 62.0 in | Wt 167.0 lb

## 2023-06-09 DIAGNOSIS — H905 Unspecified sensorineural hearing loss: Secondary | ICD-10-CM | POA: Diagnosis not present

## 2023-06-09 DIAGNOSIS — R1310 Dysphagia, unspecified: Secondary | ICD-10-CM | POA: Diagnosis not present

## 2023-06-09 DIAGNOSIS — K219 Gastro-esophageal reflux disease without esophagitis: Secondary | ICD-10-CM | POA: Diagnosis not present

## 2023-06-09 DIAGNOSIS — G510 Bell's palsy: Secondary | ICD-10-CM

## 2023-06-09 DIAGNOSIS — J387 Other diseases of larynx: Secondary | ICD-10-CM

## 2023-06-09 DIAGNOSIS — R490 Dysphonia: Secondary | ICD-10-CM

## 2023-06-09 NOTE — Progress Notes (Signed)
ENT CONSULT:  Reason for Consult: dysphagia and voice changes    HPI: Discussed the use of AI scribe software for clinical note transcription with the patient, who gave verbal consent to proceed.  History of Present Illness   The patient is an 67 yoF, with a history of Bell's palsy and fibromyalgia, was referred for evaluation of laryngeal lesion identified during an upper endoscopy. The patient initially sought medical attention due to a progressive change in voice quality and difficulty swallowing, which began last winter (12 mo ago). The voice changes were described as hoarseness and a deepening of the voice, which fluctuated in severity. The patient noted that the voice changes were particularly pronounced after eating.  In terms of swallowing, the patient reported difficulty swallowing pills, which often felt as though they were getting stuck. The patient had to break larger pills in half and place them directly in the middle of the tongue to swallow them. While the patient did not report difficulty swallowing food, they did mention that eating often triggered coughing spasms, which subsequently worsened the voice quality and caused shortness of breath.  The patient was diagnosed with severe acid reflux by a gastroenterologist, who also noted esophagitis and a Schatzky's ring during an upper endoscopy. The patient was prescribed omeprazole once daily for reflux and reported no dietary restrictions or weight loss.  The patient has a history of smoking but quit several years ago and denied heavy alcohol use. The patient also reported a history of ear infections, which resulted in thinning of the eardrum. The patient has hearing aids but does not use them regularly.  The patient also has a nasal lesion, which was biopsied and diagnosed as basal cell carcinoma by Dr Ernestene Kiel who sent her here for laryngeal lesion evaluation and management. The patient was informed that this would require further  treatment.        Records Reviewed:  Office note by Dr Ernestene Kiel 06/02/23 The patient is an 82 year old female who presents with a chief complaint of a previous growth on her vocal cord.  She has been experiencing changes in her voice, which sometimes becomes raspy or numb. She also reports severe coughing when eating or drinking, accompanied by a sensation of something being lodged in her throat. She sought medical attention due to worsening acid reflux, which was confirmed through an EGD and upper GI examination. A lump was discovered on her vocal cord during this procedure. She is currently taking omeprazole 40 mg daily in the afternoon. She also experienced esophagitis, which she initially mistook for a heart attack. Even though she lives alone, she did not call 911 as her breathing was not severely affected. She reports no facial numbness or history of stroke. She has been dealing with these voice issues for a few months but reports no shortness of breath or stridor.   She has noticed a lump on her nose that has been present for several months. She has no history of skin cancer.  She has a history of Bell's palsy and fibromyalgia.   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 Also with suspected BCC left nose s/p shave biopsy today.  1. Laryngeal mass  2. Nasal mass  3. Gastroesophageal reflux disease without esophagitis  4. LPRD (laryngopharyngeal reflux disease)   RECOMMENDATIONS: Assessment & Plan  I've recommended urgent referral to Laryngologist Dr. Irene Pap  for urgent surgical excision of the right laryngeal mass. A CT Neck with contrast will also be ordered to further evaluate the laryngeal anatomy. Pt instructed to head to emergency room for any increasing shortness of breath or stridor.   Emphasized importance  of dietary/lifestyle modifications to reduce her poorly controlled GERD symptoms.   Nasal lesion A biopsy of the nasal lump will be performed today to rule out skin cancer. Phone f/u after biopsy. If confirmed will coordinate resection with [facility] (Skin surgery center) and I will perform the recon.      Past Medical History:  Diagnosis Date   Bell's palsy    Chronic pain syndrome    Fibromyalgia    Heart murmur    Hyperlipidemia    Hypothyroidism     Past Surgical History:  Procedure Laterality Date   APPENDECTOMY     CHOLECYSTECTOMY     DILATION AND CURETTAGE OF UTERUS     multiple   ESOPHAGOGASTRODUODENOSCOPY (EGD) WITH PROPOFOL N/A 11/13/2022   Procedure: ESOPHAGOGASTRODUODENOSCOPY (EGD) WITH PROPOFOL;  Surgeon: Corbin Ade, MD;  [facility];  Service: Endoscopy;  Laterality: N/A;  12:30 pm, ASA 3   hysterectomy     partial and then completed   MALONEY DILATION N/A 11/13/2022   Procedure: MALONEY DILATION;  Surgeon: Corbin Ade, MD;  [facility]: [facility];  Service: Endoscopy;  Laterality: N/A;   TONSILLECTOMY      Family History  Problem Relation Age of Onset   Colon cancer Neg Hx     Social History:  reports that she quit smoking about 4 years ago. Her smoking use included cigarettes. She has never used smokeless tobacco. She reports that she does not currently use alcohol. She reports that she does not use drugs.  Allergies:  Allergies  Allergen Reactions   Bee Venom Anaphylaxis   Nicorette [Nicotine Polacrilex] Anaphylaxis   Penicillin G Anaphylaxis   Sulfur Anaphylaxis    Medications: I have reviewed the patient's current medications.  The PMH, PSH, Medications, Allergies, and SH were reviewed and updated.  ROS: Constitutional: Negative for fever, weight loss and weight gain. Cardiovascular: Negative for chest pain and dyspnea on exertion. Respiratory: Is not experiencing shortness of breath at rest. Gastrointestinal: Negative  for nausea and vomiting. Neurological: Negative for headaches. Psychiatric: The patient is not nervous/anxious  Blood pressure (!) 186/61, pulse 83, height 5\' 2"  (1.575 m), weight 167 lb (75.8 kg), SpO2 96%.  PHYSICAL EXAM:  Exam: General: Well-developed, well-nourished Communication and Voice: slightly raspy Respiratory Respiratory effort: Equal inspiration and expiration without stridor Cardiovascular Peripheral Vascular: Warm extremities with equal color/perfusion Eyes: No nystagmus with equal extraocular motion bilaterally Neuro/Psych/Balance: Patient oriented to person, place, and time; Appropriate mood and affect; Gait is intact with no imbalance; Cranial nerves I-XII are intact Head and Face Inspection: Normocephalic and atraumatic without mass or lesion Palpation: Facial skeleton intact without bony stepoffs Salivary Glands: No mass or tenderness Facial Strength: Facial motility symmetric and full bilaterally ENT Pinna: External ear intact and fully developed External canal: Canal is patent with intact skin Tympanic Membrane: Clear and mobile TM perforation noted on the left  External Nose: No scar or anatomic deformity Internal Nose: Septum is deviated to the left. No polyp, or purulence. Mucosal edema and erythema present.  Bilateral inferior turbinate hypertrophy.  Lips, Teeth, and gums: Mucosa and teeth intact and viable TMJ: No pain to palpation with full mobility Oral cavity/oropharynx: No erythema or exudate, no lesions present  Nasopharynx: No mass or lesion with intact mucosa Hypopharynx: Intact mucosa without pooling of secretions Larynx Glottic: Full true vocal cord mobility, VF atrophy, a round smooth lesion attached at the right ventricle near epiglottic petiole with ball valve effect on breathing and phonation, no other lesions noted glottic aperture is patent Supraglottic: Normal appearing epiglottis and AE folds Interarytenoid Space: Moderate  pachydermia&edema Subglottic Space: Patent without lesion or edema Neck Neck and Trachea: Midline trachea without mass or lesion Thyroid: No mass or nodularity Lymphatics: No lymphadenopathy  Procedure: Preoperative diagnosis: laryngeal mass  Postoperative diagnosis:   Same + GERD LPR  Procedure: Flexible fiberoptic laryngoscopy  Findings: Full true vocal cord mobility, VF atrophy, a round smooth lesion attached at the right ventricle near epiglottic petiole with ball valve effect on breathing and phonation, no other lesions noted glottic aperture is patent  Surgeon: Ashok Croon, MD  Anesthesia: Topical lidocaine and Afrin Complications: None Condition is stable throughout exam  Indications and consent:  The patient presents to the clinic with Indirect laryngoscopy view was incomplete. Thus it was recommended that they undergo a flexible fiberoptic laryngoscopy. All of the risks, benefits, and potential complications were reviewed with the patient preoperatively and verbal informed consent was obtained.  Procedure: The patient was seated upright in the clinic. Topical lidocaine and Afrin were applied to the nasal cavity. After adequate anesthesia had occurred, I then proceeded to pass the flexible telescope into the nasal cavity. The nasal cavity was patent without rhinorrhea or polyp. The nasopharynx was also patent without mass or lesion. The base of tongue was visualized and was normal. There were no signs of pooling of secretions in the piriform sinuses. The true vocal folds were mobile bilaterally. There was moderate interarytenoid pachydermia and post cricoid edema. The telescope was then slowly withdrawn and the patient tolerated the procedure throughout.      Studies Reviewed: 11/13/22 EGD    Assessment/Plan: Encounter Diagnoses  Name Primary?   Laryngeal mass Yes   Dysphagia, unspecified type    Dysphonia    Chronic GERD     Assessment and Plan    Laryngeal  mass Presents with hoarseness, voice changes, and dysphagia x 12 months, gradually worse. Seen on flexible scope exam today appears to be attached to the right anterior false fold vs vestibule with ball-valve effect. Lesion is smooth and round, likely a cyst. Requires removal to confirm diagnosis and alleviate symptoms. Discussed DML with CO2 laser surgery endoscopic approach, with anticipated outcomes including improved voice and swallowing. Risks include minor bleeding, minimized by the laser's cauterizing effect. Surgery under general anesthesia with intubation vs jet, will assess on the day of procedure. Preoperative clearance required due to age. CT scan of the neck ordered to assess lesion's extent and origin. Post-surgery swallow study to ensure safe swallowing 2/2 dysphagia sx. - Order neck CT stat - Schedule for DML bronch and CO2 laser excision  - Ordered preoperative clearance by PCP - Schedule post-surgery swallow study  Chronic dysphagia particularly with pills and coughing with meals  - MBS esophagram after surgery   Basal Cell Carcinoma of the Nose Biopsy confirmed basal cell carcinoma. Requires further treatment and excision. She will see Dr Ernestene Kiel for management. 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 Reports hearing difficulties. Encouraged regular use of hearing aids to improve hearing function.TM perf on  exam, unclear if any effect on hearing  - Encourage regular use of hearing aids - Audiogram in the future   Bell's Palsy R side  Diagnosed 15 years ago. Residual symptoms include occasional drooling and difficulty using straws. No new interventions required. Complete eye closure  - No new interventions required  Follow-up -  neck CT - scheduled 06/12/23 - Follow up with preauthorization team for CT scan - Schedule  follow-up appointment post-surgery.      Thank you for allowing me to participate in the care of this patient. Please do not hesitate to contact me with any questions or concerns.   Ashok Croon, MD Otolaryngology [facility] Phone: [phone] Fax: [phone]    06/09/2023, 3:44 PM

## 2023-07-08 ENCOUNTER — Ambulatory Visit (HOSPITAL_COMMUNITY)
Admission: RE | Admit: 2023-07-08 | Discharge: 2023-07-08 | Disposition: A | Discharge status: Home / Self Care | Payer: Medicare PPO | Source: Ambulatory Visit | Attending: Otolaryngology | Admitting: Otolaryngology

## 2023-07-08 DIAGNOSIS — J387 Other diseases of larynx: Secondary | ICD-10-CM

## 2023-07-08 MED ORDER — IOHEXOL 300 MG/ML  SOLN
75.0000 mL | Freq: Once | INTRAMUSCULAR | Status: AC | PRN
  Administered 2023-07-08: 75 mL via INTRAVENOUS

## 2023-07-16 ENCOUNTER — Telehealth (INDEPENDENT_AMBULATORY_CARE_PROVIDER_SITE_OTHER): Payer: Medicare PPO | Admitting: Otolaryngology

## 2023-07-16 DIAGNOSIS — J387 Other diseases of larynx: Secondary | ICD-10-CM

## 2023-07-16 NOTE — Progress Notes (Signed)
Telephone encounter preop  Patient is scheduled for surgery 07/23/2023.  PCP clearance form pending.  Not on blood thinners takes full dose aspirin.  Denies any changes in medical history or symptoms since last office visit 06/09/2023. She is seeing Dr Daphine Deutscher, dermatology regarding basal cell carcinoma of the nose 07/22/23.  Preoperative instructions reviewed.  All questions have been answered.

## 2023-07-17 ENCOUNTER — Telehealth (INDEPENDENT_AMBULATORY_CARE_PROVIDER_SITE_OTHER): Payer: Medicare PPO

## 2023-07-22 ENCOUNTER — Encounter (HOSPITAL_COMMUNITY): Payer: Self-pay | Admitting: General Practice

## 2023-07-22 NOTE — Progress Notes (Addendum)
PCP - Colleen Can at [facility] Cardiologist - denies  PPM/ICD - denies Device Orders - n/a Rep Notified - n/a  Chest x-ray - denies EKG - 11/11/22 Stress Test - denies ECHO - denies Cardiac Cath - denies  CPAP - denies  No DM  Blood Thinner Instructions: n/a Aspirin Instructions: per medical clearance note, patient can hold Aspirin for 5 days prior to procedure but patient was not made aware of these instructions.  Patient states her last dose of Aspirin was 1/27.  Patient instructed not to take Aspirin today or tomorrow and she verbalized understanding.   Message left for Dr. Irene Pap.    Update: 12:47 - left message for Dr. Irene Pap in regards to the patient's last dose of Aspirin.  Update: 1400 - Dr. Irene Pap aware that patient's last dose of Aspirin was 1/27 - no new orders received.   ERAS Protcol - NPO  COVID TEST- no  Anesthesia review: yes - sent on 1/27 - ordered by surgeon  Patient verbally denies any shortness of breath, fever, cough and chest pain during phone call   -------------  SDW INSTRUCTIONS given:  Your procedure is scheduled on Wednesday, January 29th, 2025.  Report to [facility] Main Entrance "A" at 5:30 A.M., and check in at the Admitting office.  Any questions or running late day of surgery: call [phone]    Remember:  Do not eat or drink  after midnight the night before your surgery     Take these medicines the morning of surgery with A SIP OF WATER:  Zetia  Omeprazole  Oxybutynin  Synthroid    May take these medicines IF NEEDED:  Hydrocodone-acetaminophen  As of today, STOP taking any Aspirin (unless otherwise instructed by your surgeon) Aleve, Naproxen, Ibuprofen, Motrin, Advil, Goody's, BC's, all herbal medications, fish oil, and all vitamins.   Do NOT Smoke (Tobacco/Vaping) 24 hours prior to your procedure  If you use a CPAP at night, you may bring all equipment for your overnight stay.     You will be asked to  remove any contacts, glasses, piercing's, hearing aid's, dentures/partials prior to surgery. Please bring cases for these items if needed.     Patients discharged the day of surgery will not be allowed to drive home, and someone needs to stay with them for 24 hours.  SURGICAL WAITING ROOM VISITATION Patients may have no more than 2 support people in the waiting area - these visitors may rotate.   Pre-op nurse will coordinate an appropriate time for 1 ADULT support person, who may not rotate, to accompany patient in pre-op.  Children under the age of 53 must have an adult with them who is not the patient and must remain in the main waiting area with an adult.  If the patient needs to stay at the hospital during part of their recovery, the visitor guidelines for inpatient rooms apply.  Please refer to the [facility] website for the visitor guidelines for any additional information.   Special instructions:    Additional instructions for the day of surgery: DO NOT APPLY any lotions, deodorants, cologne, or perfumes.   Do not wear jewelry or makeup Do not wear nail polish, gel polish, artificial nails, or any other type of covering on natural nails (fingers and toes) Do not bring valuables to the hospital. [facility] is not responsible for valuables/personal belongings. Put on clean/comfortable clothes.  Please brush your teeth.  Ask your nurse before applying any prescription medications to the  skin.    Questions were answered. Patient verbalized understanding of instructions.

## 2023-07-23 ENCOUNTER — Ambulatory Visit (HOSPITAL_COMMUNITY)
Admission: RE | Admit: 2023-07-23 | Discharge: 2023-07-23 | Disposition: A | Discharge status: Home / Self Care | Payer: Medicare PPO | Attending: Otolaryngology | Admitting: Otolaryngology

## 2023-07-23 ENCOUNTER — Ambulatory Visit (HOSPITAL_BASED_OUTPATIENT_CLINIC_OR_DEPARTMENT_OTHER): Payer: Medicare PPO | Admitting: Physician Assistant

## 2023-07-23 ENCOUNTER — Other Ambulatory Visit: Payer: Self-pay

## 2023-07-23 ENCOUNTER — Encounter (HOSPITAL_COMMUNITY)
Admission: RE | Disposition: A | Discharge status: Home / Self Care | Payer: Self-pay | Source: Home / Self Care | Attending: Otolaryngology

## 2023-07-23 ENCOUNTER — Ambulatory Visit (HOSPITAL_COMMUNITY): Payer: Self-pay | Admitting: Physician Assistant

## 2023-07-23 DIAGNOSIS — J383 Other diseases of vocal cords: Secondary | ICD-10-CM

## 2023-07-23 DIAGNOSIS — R490 Dysphonia: Secondary | ICD-10-CM | POA: Insufficient documentation

## 2023-07-23 DIAGNOSIS — G709 Myoneural disorder, unspecified: Secondary | ICD-10-CM | POA: Insufficient documentation

## 2023-07-23 DIAGNOSIS — D141 Benign neoplasm of larynx: Secondary | ICD-10-CM | POA: Diagnosis not present

## 2023-07-23 DIAGNOSIS — K449 Diaphragmatic hernia without obstruction or gangrene: Secondary | ICD-10-CM | POA: Insufficient documentation

## 2023-07-23 DIAGNOSIS — J387 Other diseases of larynx: Secondary | ICD-10-CM | POA: Diagnosis not present

## 2023-07-23 DIAGNOSIS — R1310 Dysphagia, unspecified: Secondary | ICD-10-CM | POA: Diagnosis not present

## 2023-07-23 DIAGNOSIS — E039 Hypothyroidism, unspecified: Secondary | ICD-10-CM | POA: Insufficient documentation

## 2023-07-23 DIAGNOSIS — M797 Fibromyalgia: Secondary | ICD-10-CM | POA: Insufficient documentation

## 2023-07-23 DIAGNOSIS — N289 Disorder of kidney and ureter, unspecified: Secondary | ICD-10-CM | POA: Insufficient documentation

## 2023-07-23 DIAGNOSIS — Z87891 Personal history of nicotine dependence: Secondary | ICD-10-CM | POA: Insufficient documentation

## 2023-07-23 DIAGNOSIS — K219 Gastro-esophageal reflux disease without esophagitis: Secondary | ICD-10-CM | POA: Insufficient documentation

## 2023-07-23 HISTORY — PX: MICROLARYNGOSCOPY WITH CO2 LASER AND EXCISION OF VOCAL CORD LESION: SHX5970

## 2023-07-23 HISTORY — DX: Gastro-esophageal reflux disease without esophagitis: K21.9

## 2023-07-23 HISTORY — PX: RIGID BRONCHOSCOPY: SHX5069

## 2023-07-23 HISTORY — DX: Personal history of other diseases of the digestive system: Z87.19

## 2023-07-23 LAB — CBC
HCT: 39.2 % (ref 36.0–46.0)
Hemoglobin: 12.7 g/dL (ref 12.0–15.0)
MCH: 29.9 pg (ref 26.0–34.0)
MCHC: 32.4 g/dL (ref 30.0–36.0)
MCV: 92.2 fL (ref 80.0–100.0)
Platelets: 331 10*3/uL (ref 150–400)
RBC: 4.25 MIL/uL (ref 3.87–5.11)
RDW: 13.8 % (ref 11.5–15.5)
WBC: 10 10*3/uL (ref 4.0–10.5)
nRBC: 0 % (ref 0.0–0.2)

## 2023-07-23 LAB — BASIC METABOLIC PANEL
Anion gap: 8 (ref 5–15)
BUN: 16 mg/dL (ref 8–23)
CO2: 26 mmol/L (ref 22–32)
Calcium: 9 mg/dL (ref 8.9–10.3)
Chloride: 103 mmol/L (ref 98–111)
Creatinine, Ser: 1.28 mg/dL — ABNORMAL HIGH (ref 0.44–1.00)
GFR, Estimated: 42 mL/min — ABNORMAL LOW (ref >60)
Glucose, Bld: 106 mg/dL — ABNORMAL HIGH (ref 70–99)
Potassium: 3.8 mmol/L (ref 3.5–5.1)
Sodium: 137 mmol/L (ref 135–145)

## 2023-07-23 LAB — NO BLOOD PRODUCTS

## 2023-07-23 SURGERY — MICROLARYNGOSCOPY WITH CO2 LASER AND EXCISION OF VOCAL CORD LESION
Anesthesia: General | Site: Throat

## 2023-07-23 MED ORDER — ORAL CARE MOUTH RINSE: 15.0000 mL | Freq: Once | OROMUCOSAL | Status: AC

## 2023-07-23 MED ORDER — ACETAMINOPHEN 10 MG/ML IV SOLN
1000.0000 mg | Freq: Once | INTRAVENOUS | Status: DC | PRN
  Administered 2023-07-23: 1000 mg via INTRAVENOUS

## 2023-07-23 MED ORDER — DEXAMETHASONE SODIUM PHOSPHATE 10 MG/ML IJ SOLN
INTRAMUSCULAR | Status: AC
  Filled 2023-07-23: qty 1

## 2023-07-23 MED ORDER — TRIAMCINOLONE ACETONIDE 40 MG/ML IJ SUSP
INTRAMUSCULAR | Status: AC
  Filled 2023-07-23: qty 5

## 2023-07-23 MED ORDER — ROCURONIUM BROMIDE 10 MG/ML (PF) SYRINGE
PREFILLED_SYRINGE | INTRAVENOUS | Status: DC | PRN
  Administered 2023-07-23: 50 mg via INTRAVENOUS
  Administered 2023-07-23: 10 mg via INTRAVENOUS

## 2023-07-23 MED ORDER — FENTANYL CITRATE (PF) 100 MCG/2ML IJ SOLN
25.0000 ug | INTRAMUSCULAR | Status: DC | PRN
  Administered 2023-07-23 (×2): 25 ug via INTRAVENOUS

## 2023-07-23 MED ORDER — ONDANSETRON HCL 4 MG/2ML IJ SOLN
INTRAMUSCULAR | Status: AC
  Filled 2023-07-23: qty 2

## 2023-07-23 MED ORDER — ROCURONIUM BROMIDE 10 MG/ML (PF) SYRINGE
PREFILLED_SYRINGE | INTRAVENOUS | Status: AC
  Filled 2023-07-23: qty 10

## 2023-07-23 MED ORDER — PHENYLEPHRINE 80 MCG/ML (10ML) SYRINGE FOR IV PUSH (FOR BLOOD PRESSURE SUPPORT)
PREFILLED_SYRINGE | INTRAVENOUS | Status: DC | PRN
  Administered 2023-07-23: 160 ug via INTRAVENOUS

## 2023-07-23 MED ORDER — METOPROLOL TARTRATE 5 MG/5ML IV SOLN
INTRAVENOUS | Status: DC | PRN
  Administered 2023-07-23: 1 mg via INTRAVENOUS
  Administered 2023-07-23: 1.5 mg via INTRAVENOUS

## 2023-07-23 MED ORDER — LIDOCAINE-EPINEPHRINE 1 %-1:100000 IJ SOLN
INTRAMUSCULAR | Status: AC
  Filled 2023-07-23: qty 1

## 2023-07-23 MED ORDER — OXYMETAZOLINE HCL 0.05 % NA SOLN
NASAL | Status: AC
  Filled 2023-07-23: qty 30

## 2023-07-23 MED ORDER — FENTANYL CITRATE (PF) 250 MCG/5ML IJ SOLN
INTRAMUSCULAR | Status: DC | PRN
  Administered 2023-07-23 (×4): 50 ug via INTRAVENOUS

## 2023-07-23 MED ORDER — DEXAMETHASONE SODIUM PHOSPHATE 10 MG/ML IJ SOLN
INTRAMUSCULAR | Status: DC | PRN
  Administered 2023-07-23: 10 mg via INTRAVENOUS

## 2023-07-23 MED ORDER — LIDOCAINE 2% (20 MG/ML) 5 ML SYRINGE
INTRAMUSCULAR | Status: AC
Start: 2023-07-23 — End: ?
  Filled 2023-07-23: qty 5

## 2023-07-23 MED ORDER — CHLORHEXIDINE GLUCONATE 0.12 % MT SOLN
15.0000 mL | Freq: Once | OROMUCOSAL | Status: AC
  Administered 2023-07-23: 15 mL via OROMUCOSAL
  Filled 2023-07-23: qty 15

## 2023-07-23 MED ORDER — FENTANYL CITRATE (PF) 100 MCG/2ML IJ SOLN
INTRAMUSCULAR | Status: AC
  Filled 2023-07-23: qty 2

## 2023-07-23 MED ORDER — DEXMEDETOMIDINE HCL IN NACL 80 MCG/20ML IV SOLN
INTRAVENOUS | Status: DC | PRN
  Administered 2023-07-23: 6 ug via INTRAVENOUS
  Administered 2023-07-23: 8 ug via INTRAVENOUS

## 2023-07-23 MED ORDER — FENTANYL CITRATE (PF) 250 MCG/5ML IJ SOLN
INTRAMUSCULAR | Status: AC
  Filled 2023-07-23: qty 5

## 2023-07-23 MED ORDER — LIDOCAINE 2% (20 MG/ML) 5 ML SYRINGE
INTRAMUSCULAR | Status: DC | PRN
  Administered 2023-07-23: 60 mg via INTRAVENOUS

## 2023-07-23 MED ORDER — ACETAMINOPHEN 10 MG/ML IV SOLN
INTRAVENOUS | Status: AC
  Filled 2023-07-23: qty 100

## 2023-07-23 MED ORDER — LACTATED RINGERS IV SOLN: INTRAVENOUS | Status: DC

## 2023-07-23 MED ORDER — PROPOFOL 10 MG/ML IV BOLUS
INTRAVENOUS | Status: AC
  Filled 2023-07-23: qty 20

## 2023-07-23 MED ORDER — ONDANSETRON HCL 4 MG/2ML IJ SOLN
INTRAMUSCULAR | Status: DC | PRN
  Administered 2023-07-23: 4 mg via INTRAVENOUS

## 2023-07-23 MED ORDER — 0.9 % SODIUM CHLORIDE (POUR BTL) OPTIME
TOPICAL | Status: DC | PRN
  Administered 2023-07-23: 1000 mL

## 2023-07-23 MED ORDER — LABETALOL HCL 5 MG/ML IV SOLN
INTRAVENOUS | Status: DC | PRN
  Administered 2023-07-23: 5 mg via INTRAVENOUS
  Administered 2023-07-23: 2.5 mg via INTRAVENOUS

## 2023-07-23 MED ORDER — OXYMETAZOLINE HCL 0.05 % NA SOLN
NASAL | Status: DC | PRN
  Administered 2023-07-23: 1 via TOPICAL

## 2023-07-23 MED ORDER — SUGAMMADEX SODIUM 200 MG/2ML IV SOLN
INTRAVENOUS | Status: DC | PRN
  Administered 2023-07-23: 200 mg via INTRAVENOUS

## 2023-07-23 MED ORDER — PROPOFOL 10 MG/ML IV BOLUS
INTRAVENOUS | Status: DC | PRN
  Administered 2023-07-23: 100 mg via INTRAVENOUS

## 2023-07-23 SURGICAL SUPPLY — 29 items
CANISTER SUCT 3000ML PPV (MISCELLANEOUS) ×1 IMPLANT
CNTNR URN SCR LID CUP LEK RST (MISCELLANEOUS) IMPLANT
COVER BACK TABLE 60X90IN (DRAPES) ×1 IMPLANT
COVER MAYO STAND STRL (DRAPES) ×1 IMPLANT
DRAPE HALF SHEET 40X57 (DRAPES) ×1 IMPLANT
GLOVE BIO SURGEON STRL SZ 6 (GLOVE) ×1 IMPLANT
GOWN STRL REUS W/TWL LRG LVL3 (GOWN DISPOSABLE) ×2 IMPLANT
GUARD TEETH (MISCELLANEOUS) IMPLANT
KIT BASIN OR (CUSTOM PROCEDURE TRAY) ×1 IMPLANT
KIT PROLARN PLUS GEL W/NDL (Prosthesis and Implant ENT) IMPLANT
KIT TURNOVER KIT B (KITS) ×1 IMPLANT
MARKER SKIN DUAL TIP RULER LAB (MISCELLANEOUS) IMPLANT
NDL HYPO 25GX1X1/2 BEV (NEEDLE) IMPLANT
NEEDLE HYPO 25GX1X1/2 BEV (NEEDLE)
NEEDLE INJECT RIGID (NEEDLE) ×1
NEEDLE INJECT RIGID 21GA 14.6 (NEEDLE) IMPLANT
NS IRRIG 1000ML POUR BTL (IV SOLUTION) ×1 IMPLANT
PAD ARMBOARD 7.5X6 YLW CONV (MISCELLANEOUS) ×2 IMPLANT
PATTIES SURGICAL .5 X.5 (GAUZE/BANDAGES/DRESSINGS) ×1 IMPLANT
POSITIONER HEAD DONUT 9IN (MISCELLANEOUS) ×1 IMPLANT
SET COLLECT BLD 25X3/4 12 (NEEDLE) IMPLANT
SOL ANTI FOG 6CC (MISCELLANEOUS) ×1 IMPLANT
SURGILUBE 2OZ TUBE FLIPTOP (MISCELLANEOUS) ×1 IMPLANT
SYR 3ML LL SCALE MARK (SYRINGE) IMPLANT
SYR TB 1ML LUER SLIP (SYRINGE) IMPLANT
TOWEL GREEN STERILE FF (TOWEL DISPOSABLE) ×2 IMPLANT
TUBE CONNECTING 12X1/4 (SUCTIONS) ×2 IMPLANT
TUBE ENDO 5.0 TENAX + STYLET (TUBING) IMPLANT
WATER STERILE IRR 1000ML POUR (IV SOLUTION) ×1 IMPLANT

## 2023-07-23 NOTE — H&P (Addendum)
Peggy Graves is an 83 y.o. female.    Chief Complaint:  laryngeal mass  HPI: Patient presents today for planned elective procedure.  He/she denies any interval change in history since office visit on 06/08/24. Denies new sx, denies dyspnea, CP, N/V. No new medications since last office visit.    Past Medical History:  Diagnosis Date   Acid reflux    Bell's palsy    Chronic pain syndrome    Fibromyalgia    Heart murmur    History of hiatal hernia    Hyperlipidemia    Hypothyroidism     Past Surgical History:  Procedure Laterality Date   APPENDECTOMY     CHOLECYSTECTOMY     DILATION AND CURETTAGE OF UTERUS     multiple   ESOPHAGOGASTRODUODENOSCOPY (EGD) WITH PROPOFOL N/A 11/13/2022   Procedure: ESOPHAGOGASTRODUODENOSCOPY (EGD) WITH PROPOFOL;  Surgeon: Corbin Ade, MD;  [facility];  Service: Endoscopy;  Laterality: N/A;  12:30 pm, ASA 3   hysterectomy     partial and then completed   MALONEY DILATION N/A 11/13/2022   Procedure: MALONEY DILATION;  Surgeon: Corbin Ade, MD;  [facility];  Service: Endoscopy;  Laterality: N/A;   TONSILLECTOMY      Family History  Problem Relation Age of Onset   Colon cancer Neg Hx     Social History:  reports that she quit smoking about 5 years ago. Her smoking use included cigarettes. She has never used smokeless tobacco. She reports that she does not currently use alcohol. She reports that she does not use drugs.  Allergies:  Allergies  Allergen Reactions   Bee Venom Anaphylaxis   Nicorette [Nicotine Polacrilex] Anaphylaxis   Penicillin G Anaphylaxis   Sulfa Antibiotics Anaphylaxis    Medications Prior to Admission  Medication Sig Dispense Refill   aspirin 325 MG tablet Take 325 mg by mouth daily.     Cholecalciferol (VITAMIN D) 50 MCG (2000 UT) tablet Take 2,000 Units by mouth daily.     Cyanocobalamin (B-12) 1000 MCG TABS Take 2,000 mcg by mouth daily.     ezetimibe (ZETIA) 10 MG tablet Take 10 mg  by mouth daily.     furosemide (LASIX) 20 MG tablet Take 20 mg by mouth daily.     HYDROcodone-acetaminophen (NORCO/VICODIN) 5-325 MG tablet Take 1 tablet by mouth every 6 (six) hours as needed for moderate pain.     levothyroxine (SYNTHROID) 100 MCG tablet Take 100 mcg by mouth daily before breakfast.     magnesium oxide (MAG-OX) 400 (240 Mg) MG tablet Take 400 mg by mouth daily.     meloxicam (MOBIC) 7.5 MG tablet Take 7.5 mg by mouth daily.     omeprazole (PRILOSEC) 40 MG capsule Take 1 capsule (40 mg total) by mouth daily. 30 capsule 11   OVER THE COUNTER MEDICATION Take 1 tablet by mouth daily. Del-immune v supplement     oxybutynin (DITROPAN) 5 MG tablet Take 5 mg by mouth daily.     EPINEPHrine (EPIPEN 2-PAK) 0.3 mg/0.3 mL IJ SOAJ injection Inject 0.3 mg into the muscle as needed for anaphylaxis.      Results for orders placed or performed during the hospital encounter of 07/23/23 (from the past 48 hours)  CBC per protocol     Status: None   Collection Time: 07/23/23  6:12 AM  Result Value Ref Range   WBC 10.0 4.0 - 10.5 K/uL   RBC 4.25 3.87 - 5.11 MIL/uL  Hemoglobin 12.7 12.0 - 15.0 g/dL   HCT 96.0 45.4 - 09.8 %   MCV 92.2 80.0 - 100.0 fL   MCH 29.9 26.0 - 34.0 pg   MCHC 32.4 30.0 - 36.0 g/dL   RDW 11.9 14.7 - 82.9 %   Platelets 331 150 - 400 K/uL   nRBC 0.0 0.0 - 0.2 %    Comment: Performed at [facility], [address], [place], [place] [address]   No results found.  ROS: Review of Systems  Constitutional:  Negative for chills and fever.  HENT:  Negative for ear pain and nosebleeds.   Eyes:  Negative for double vision.  Respiratory:  Negative for cough and shortness of breath.   Cardiovascular:  Negative for chest pain and palpitations.  Gastrointestinal:  Negative for nausea and vomiting.  Genitourinary:  Negative for dysuria.  Musculoskeletal:  Negative for myalgias.  Skin:  Negative for rash.  Neurological:  Negative for dizziness.   Psychiatric/Behavioral:  Negative for memory loss.     Blood pressure (!) 199/65, pulse 88, temperature 98.2 F (36.8 C), temperature source Oral, resp. rate 20, height 5\' 2"  (1.575 m), weight 77.1 kg, SpO2 94%.  PHYSICAL EXAM: Physical Exam Constitutional:      Appearance: Normal appearance.  HENT:     Head: Normocephalic and atraumatic.     Nose: Nose normal.     Mouth/Throat:     Mouth: Mucous membranes are moist.  Eyes:     Extraocular Movements: Extraocular movements intact.     Pupils: Pupils are equal, round, and reactive to light.  Cardiovascular:     Rate and Rhythm: Normal rate.     Pulses: Normal pulses.  Pulmonary:     Effort: No respiratory distress.     Breath sounds: No stridor.  Abdominal:     Palpations: Abdomen is soft.  Musculoskeletal:        General: No swelling or deformity.  Skin:    General: Skin is warm and dry.  Neurological:     Mental Status: She is alert.     Cranial Nerves: No cranial nerve deficit.  Psychiatric:        Mood and Affect: Mood normal.     Studies Reviewed:  CT neck 07/08/23 FINDINGS: Pharynx and larynx: Nasopharynx contours appear symmetric and normal. There is asymmetric but indistinct soft tissue at the right oropharynx series 2, image 31, below the level of the soft palate and near the base of tongue. No definite hyperenhancement, and this is not well correlated on the sagittal image.   Remaining oropharynx, base of tongue, vallecula, and hypopharynx appear normal. Upper epiglottis remains normal.   Abnormal larynx with a midline polypoid lesion encompassing 12 x 9 x 14 mm (AP by transverse by CC) which seems most directly associated with the anterior commissure (series 2, image 51) inseparable from the true cords, extending to below the glottis on these images (sagittal image 53). Piriform sinuses, AE folds appear symmetric and within normal limits. Laryngeal cartilages appear to remain normal, symmetric.    Salivary glands: Negative.  Negative sublingual space.   Thyroid: Diminutive, negative.   Lymph nodes: Negative.  No cervical lymphadenopathy.   Vascular: Major vascular structures in the neck and at the skull base are patent. There is bilateral cervical carotid combined soft and calcified atherosclerosis, not definitely hemodynamically significant.   Limited intracranial: Calcified atherosclerosis at the skull base.   Visualized orbits: Postoperative changes to both globes, otherwise negative.  Mastoids and visualized paranasal sinuses: Visualized paranasal sinuses and mastoids are well aerated.   Skeleton: Absent dentition. TMJ and cervical spine degeneration. No acute or suspicious osseous lesion identified.   Upper chest: Subglottic airway below the level of the polypoid mass appears normal. Negative visible superior mediastinum. Calcified aortic atherosclerosis. Some evidence of subtle centrilobular emphysema in the upper lungs, which otherwise appear negative. No   IMPRESSION: 1. Midline Laryngeal Polypoid Lesion, 12 x 9 x 14 mm, seemingly arising from the anterior commissure, and tracking below the level of the true cords.   2. Artifact versus Right oropharyngeal mass: Rounded 15 mm asymmetric and mass-like soft tissue of the right tonsillar pillar on series 2, image 31. Attention directed here on any future endoscopy.   3. No cervical lymphadenopathy.  No metastatic disease identified.   4. Emphysema (ICD10-J43.9).   5. 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  Assessment/Plan Laryngeal Mass Dysphonia Dysphagia Vocal fold atrophy Glottic insufficiency Right pharyngeal asymmetry on CT neck  - risks and benefits were discussed and she would like to proceed - will proceed with DML rigid bronchoscopy CO2 laser excision of laryngeal mass, possible VF injection augmentation and oropharyngeal exam possible biopsy of the right oropharynx 2/2  asymmetry on imaging   Ashok Croon, MD     Ashok Croon 07/23/2023, 6:47 AM

## 2023-07-23 NOTE — Transfer of Care (Signed)
Immediate Anesthesia Transfer of Care Note  Patient: Peggy Graves  Procedure(s) Performed: MICROLARYNGOSCOPY WITH CO2 LASER AND EXCISION OF VOCAL CORD LESION RIGID BRONCHOSCOPY  Patient Location: PACU  Anesthesia Type:General  Level of Consciousness: awake, alert , and oriented  Airway & Oxygen Therapy: Patient Spontanous Breathing and Patient connected to face mask oxygen  Post-op Assessment: Report given to RN and Post -op Vital signs reviewed and stable  Post vital signs: Reviewed and stable  Last Vitals:  Vitals Value Taken Time  BP 155/37 07/23/23 0945  Temp    Pulse 66 07/23/23 0948  Resp 18 07/23/23 0948  SpO2 98 % 07/23/23 0948  Vitals shown include unfiled device data.  Last Pain:  Vitals:   07/23/23 0937  TempSrc:   PainSc: Asleep         Complications: No notable events documented.

## 2023-07-23 NOTE — Op Note (Signed)
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 Right false fold/ventricular mass 2. 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 Direct microlaryngoscopy with excision of the right false fold/ventricular mass with CO2 laser 2. Rigid bronchoscopy 3. Bilateral injection augmentation with Prolaryn Plus 4. Direct laryngoscopy and exam of the oropharynx under anesthesia   SURGEON: Ashok Croon, MD  ASSISTANT: none  ANESTHESIA: General endotracheal with a laser safe tube  COMPLICATIONS: None.  ESTIMATED BLOOD LOSS: < 5 mL  INTRAOPERATIVE FINDINGS: 1. Easy exposure using female Sataloff  2. Flesh colored smooth cystic mass which appears to originate from the ventricular space between the false fold and true vocal fold on the right side (attached near the mid 1/3 of the true vocal fold), filled with mucus 3. No other lesions in the subglottis or proximal trachea 4. Exam of the oropharynx/base of the tongue did not reveal any asymmetry of mucosal changes, no evidence of masses in the area of concern detected on CT neck   SPECIMEN: 1. Right false fold cyst for permanent   INDICATIONS AND CONSENT: 55 yoF who presented to our office with history of dysphonia, and a laryngeal mass noted when she underwent EGD. Exam revealed a flesh colored smooth round mass which appeared to be near the anterior commissure/false fold area on the right side. There was also evidence of vocal fold atrophy and glottic insufficiency. CT neck with contrast demonstrated slight asymmetry along the right side of the oropharynx and base of the tongue, and evidence of the laryngeal lesion, but no evidence of cervical lymphadenopathy. The patient was therefore offered the aforementioned procedures. The procedure, risks, benefits, alternatives, potential complications,  possible outcomes as well as the option of no treatment were reviewed with the patient who indicated their understanding and wished to proceed forward with the procedure. Informed consent was obtained.  DESCRIPTION OF PROCEDURE: On 07/23/23, the patient was brought to the operating room by the anesthesia team. The patient was transferred onto the operating table and placed in supine position. After smooth induction of general endotracheal anesthesia, a surgeon initiated time-out was performed. The bed had been rotated 90 degrees. The patient was then draped in the appropriate fashion.  We first began by placement of wet gauze over over the maxillary alveolar ridge for protection during the procedure since the patient was edentulous. Using a female Sataloff laryngoscope we first evaluated the oropharynx and the base of the tongue on the right and there were no masses/lesions or irregular appearing mucosa. We palpated the area of concern along the right oropharynx and base of the tongue and no lesions were noted with palpation. We then proceeded with laryngeal exposure. The female Sataloff laryngoscope was then advanced into the oral cavity and oropharynx until the larynx was brought into view. The initial exposure was adequate. Once the larynx was adequately visualized the patient was placed into suspension. A 0 degree Hopkins endoscope were then used to visualize the larynx and photo documentation was obtained. Thorough examination including palpation revealed a round smooth cystic lesion at the anterior to mid aspect of the right false fold with attachment at the ventricular space above the true vocal fold. Rigid bronchoscopy did not reveal any new lesions along the infraglottic portion of the true vocal folds, subglottis or carina. Given these findings, we decided to proceed with excision.  The operating microscope was brought into the field. Saline  soaked blue towels were wrapped around the face and  laryngoscope for protection. All operating personnel were confirmed to be wearing laser safe glasses.  A laser safety time out was then conducted in the usual standard fashion. We first began by utilizing the CO2 laser with the operating microscope. The lesion was lightly retracted medially with a sweetheart retractor.  A setting of 3 Watts, superpulsed in continuous mode was used to excise the lesion and control the bleeding. The lesion appeared to be a cystic lesion filled with mucus. The final specimen was sent for permanent histopathology. At the end of the excision, the bleeding was controlled with Afrin soaked pledgets. We then used Prolaryn Plus to inject approximately 0.2-0.3 ml of the filler into the paraglottic space bilaterally. Photo documentation was then once again obtained using the endoscope. The trachea was suctioned of secretions. The patient was then removed from suspension and the Sataloff laryngoscope was carefully removed without injury to the oral mucosa. The mouth was  inspected with no evidence of injury to the teeth. This completed the procedure. The patient tolerated the procedure well without any immediate post operative complications. All counts were correct x2 at the conclusion of the procedure.

## 2023-07-23 NOTE — Discharge Instructions (Signed)
Direct Laryngoscopy Patient Discharge Instructions    What can I expect after surgery?  The recovery period after this procedure is generally smooth and  uncomplicated. However, an occasional situation may arise which can be  distressing for you as a patient, or a family member. The following general  information and suggestions may help you through this period.  How will I feel after surgery?   A sore throat is common following this surgery and may last for 1 week.   Sores in the mouth, or swelling of the lips may occur, and should resolve  within one week.   You may experience some nausea/vomiting which should improve in the  first day.   You may have low-grade fever, less than 100 degrees for a few days.  Are there diet restrictions after surgery?  Liquid is essential. Start with ice chips, sips of water, or your favorite  fruit juice drink, then progress to at least an 8 ounce glass of liquid per  hour until you are able to tolerate solid food.   If swallowing liquids causes chest pain or shortness of breath, please  contact your doctor immediately.  Cold liquids, non-acidic juices, sherbet ice cream, and Popsicles are  tolerated better within the first 24 hour period. You may then progress to  soft foods gradually (Jell-O, custard, soft boiled or scrambled eggs,  pudding, mashed potatoes).   Drink lots of fluids to keep your throat moist.   Avoid acidic foods and juices, (orange, tomato) salty, and fried foods (potato chips, [place] fries, hard toast, and popcorn). Are there activity restrictions after surgery?  Rest with limited activity at home for 24-72 hours.   Avoid lifting (greater than 10 pounds), straining, or vigorous activities.  You may return to work/school in generally after 1 to 2 days.  How do I manage pain after surgery?  You will receive prescriptions for pain medications, possibly antibiotics  and medications for nausea if needed.   Take Tylenol every 6  hrs for pain. You can also take Ibuprofen or Motrin/Advil  for pain unless you primary care doctor advised you to avoid these medications   Take pain medication as prescribed every 4-6 hours as needed. Eating will  be easier one-half hour after taking pain medication.   What follow-up care will I receive?  Pathology results can take between 3-7 days to obtain.   You will be given an appointment to return to clinic for a post-operative  check before you are discharged from the hospital. This will usually be  about one week after surgery.   Bring any questions you have to this appointment. If you are unable to  keep the appointment, please be sure to call and reschedule.  When should I call my doctor?  If you have chest pain, lightheadedness within the first 48 hours or upon  standing.  If you have difficulty breathing inability to swallow.  If you have persistent vomiting.  If you have progressive low neck pain.  If you have an oral temperature over 100.5 degrees. Check to make sure  they are getting enough liquids. Dehydration can cause the body  temperature to rise.  You can resume all of your home medications after this surgery.   Certain voice conditions benefit from absolute voice rest for 2 days straight.  Do not talk at all.  Whispering is actually even harder on the vocal cords than talking, so don't whisper either.  You will need a dry erase board or  a notebook to write in so that you can communicate.   Your doctor may have given you a steroid burst (such as prednisone or methylprednisolone) to take at the same time you are resting your voice.  Unless she told you differently, you should start this steroid burst at the same time you start your voice rest.  After your period of voice rest, you may begin to use your voice at a soft spoken level (but NOT a whisper).  Some patients will need to get back to regular voice use immediately, but if you are able to increase gradually, this  is better. An example would be to start with three 5-minute conversations the first day after finishing complete voice rest.  Start by talking in shorter intervals (i.e. 5 minutes at a time x 3 instead of all your voice use at once, for 15 minutes straight). -You should only speak loud enough for people to hear you that are within an arm's length away. Do not yell or whisper.  Follow general vocal hygiene instructions during this time as well.   Patients usually recover physically from the surgery within a few days, and often are back to regular activity the next day. You should try to avoid clearing your throat or excessive coughing after surgery.    You should already have post-op follow-up scheduled with Dr Irene Pap. If not, please contact her office at [phone] to schedule your follow-up appointment in 10-14 days.

## 2023-07-23 NOTE — Anesthesia Preprocedure Evaluation (Signed)
Anesthesia Evaluation  Patient identified by MRN, date of birth, ID band Patient awake    Reviewed: Allergy & Precautions, NPO status , Patient's Chart, lab work & pertinent test results  History of Anesthesia Complications Negative for: history of anesthetic complications  Airway Mallampati: III  TM Distance: >3 FB Neck ROM: Full    Dental  (+) Edentulous Upper, Edentulous Lower, Dental Advisory Given   Pulmonary neg shortness of breath, neg sleep apnea, neg COPD, neg recent URI, former smoker   breath sounds clear to auscultation       Cardiovascular negative cardio ROS  Rhythm:Regular     Neuro/Psych  Neuromuscular disease  negative psych ROS   GI/Hepatic Neg liver ROS, hiatal hernia,GERD  ,,  Endo/Other  Hypothyroidism    Renal/GU Renal InsufficiencyRenal diseaseLab Results      Component                Value               Date                      NA                       137                 07/23/2023                K                        3.8                 07/23/2023                CO2                      26                  07/23/2023                GLUCOSE                  106 (H)             07/23/2023                BUN                      16                  07/23/2023                CREATININE               1.28 (H)            07/23/2023                CALCIUM                  9.0                 07/23/2023                GFRNONAA                 42 (L)              07/23/2023  Musculoskeletal  (+)  Fibromyalgia -  Abdominal   Peds  Hematology negative hematology ROS (+)   Anesthesia Other Findings Right sided bell's palsy  Reproductive/Obstetrics                              Anesthesia Physical Anesthesia Plan  ASA: 3  Anesthesia Plan: General   Post-op Pain Management: Minimal or no pain anticipated   Induction: Intravenous  PONV Risk Score  and Plan: 3 and Ondansetron and Dexamethasone  Airway Management Planned: Oral ETT  Additional Equipment: None  Intra-op Plan:   Post-operative Plan: Extubation in OR  Informed Consent: I have reviewed the patients History and Physical, chart, labs and discussed the procedure including the risks, benefits and alternatives for the proposed anesthesia with the patient or authorized representative who has indicated his/her understanding and acceptance.     Dental advisory given  Plan Discussed with: CRNA  Anesthesia Plan Comments:          Anesthesia Quick Evaluation

## 2023-07-23 NOTE — Anesthesia Procedure Notes (Signed)
Procedure Name: Intubation Date/Time: 07/23/2023 8:19 AM  Performed by: Sudie Grumbling, CRNAPre-anesthesia Checklist: Patient identified, Emergency Drugs available, Suction available and Patient being monitored Patient Re-evaluated:Patient Re-evaluated prior to induction Oxygen Delivery Method: Circle system utilized Preoxygenation: Pre-oxygenation with 100% oxygen Induction Type: IV induction Ventilation: Mask ventilation without difficulty Laryngoscope Size: Glidescope Grade View: Grade I Tube type: Oral Laser Tube: Laser Tube and Cuffed inflated with minimal occlusive pressure - saline Tube size: 5.0 mm Number of attempts: 1 Airway Equipment and Method: Stylet and Oral airway Placement Confirmation: ETT inserted through vocal cords under direct vision, positive ETCO2 and breath sounds checked- equal and bilateral Secured at: 22 cm Tube secured with: Tape Dental Injury: Teeth and Oropharynx as per pre-operative assessment

## 2023-07-23 NOTE — Anesthesia Postprocedure Evaluation (Signed)
Anesthesia Post Note  Patient: Peggy Graves  Procedure(s) Performed: MICROLARYNGOSCOPY WITH CO2 LASER AND EXCISION OF VOCAL CORD LESION (Throat) RIGID BRONCHOSCOPY (Throat)     Patient location during evaluation: PACU Anesthesia Type: General Level of consciousness: awake and alert Pain management: pain level controlled Vital Signs Assessment: post-procedure vital signs reviewed and stable Respiratory status: spontaneous breathing, nonlabored ventilation and respiratory function stable Cardiovascular status: blood pressure returned to baseline and stable Postop Assessment: no apparent nausea or vomiting Anesthetic complications: no   No notable events documented.               Laurette Villescas

## 2023-07-24 ENCOUNTER — Encounter (HOSPITAL_COMMUNITY): Payer: Self-pay | Admitting: Otolaryngology

## 2023-07-24 ENCOUNTER — Telehealth (INDEPENDENT_AMBULATORY_CARE_PROVIDER_SITE_OTHER): Payer: Self-pay | Admitting: Otolaryngology

## 2023-07-24 LAB — SURGICAL PATHOLOGY

## 2023-07-24 NOTE — Telephone Encounter (Signed)
Patient called with postop questions regarding her medications after surgery. Stated her pharmacy has not received the prescriptions.

## 2023-08-01 ENCOUNTER — Other Ambulatory Visit: Payer: Self-pay | Admitting: Otolaryngology

## 2023-08-07 ENCOUNTER — Ambulatory Visit (INDEPENDENT_AMBULATORY_CARE_PROVIDER_SITE_OTHER): Payer: Medicare PPO | Admitting: Otolaryngology

## 2023-08-07 ENCOUNTER — Encounter (INDEPENDENT_AMBULATORY_CARE_PROVIDER_SITE_OTHER): Payer: Self-pay | Admitting: Otolaryngology

## 2023-08-07 VITALS — HR 82

## 2023-08-07 DIAGNOSIS — J387 Other diseases of larynx: Secondary | ICD-10-CM | POA: Diagnosis not present

## 2023-08-07 DIAGNOSIS — J383 Other diseases of vocal cords: Secondary | ICD-10-CM

## 2023-08-07 DIAGNOSIS — R490 Dysphonia: Secondary | ICD-10-CM

## 2023-08-07 NOTE — Progress Notes (Signed)
ENT Progress Note:  Update 08/07/2023  Discussed the use of AI scribe software for clinical note transcription with the patient, who gave verbal consent to proceed.  History of Present Illness   Peggy Graves is an 83 year old female who presents for follow-up after DML and removal of a right false fold mass, with final pathology (+) oncocytic cyst.  She is doing well post-procedure with no throat discomfort. She can now swallow pills without them getting stuck, which was an issue prior to the surgery, particularly with her magnesium pill. There is an improvement in her voice, which is less 'squeaky' than before. She carries water with her at all times due to increased thirst caused by one of her medications.  Records Reviewed:  Initial Evaluation  Reason for Consult: dysphagia and voice changes    HPI: Discussed the use of AI scribe software for clinical note transcription with the patient, who gave verbal consent to proceed.  History of Present Illness   The patient is an 6 yoF, with a history of Bell's palsy and fibromyalgia, was referred for evaluation of laryngeal lesion identified during an upper endoscopy. The patient initially sought medical attention due to a progressive change in voice quality and difficulty swallowing, which began last winter (12 mo ago). The voice changes were described as hoarseness and a deepening of the voice, which fluctuated in severity. The patient noted that the voice changes were particularly pronounced after eating.  In terms of swallowing, the patient reported difficulty swallowing pills, which often felt as though they were getting stuck. The patient had to break larger pills in half and place them directly in the middle of the tongue to swallow them. While the patient did not report difficulty swallowing food, they did mention that eating often triggered coughing spasms, which subsequently worsened the voice quality and caused shortness of  breath.  The patient was diagnosed with severe acid reflux by a gastroenterologist, who also noted esophagitis and a Schatzky's ring during an upper endoscopy. The patient was prescribed omeprazole once daily for reflux and reported no dietary restrictions or weight loss.  The patient has a history of smoking but quit several years ago and denied heavy alcohol use. The patient also reported a history of ear infections, which resulted in thinning of the eardrum. The patient has hearing aids but does not use them regularly.  The patient also has a nasal lesion, which was biopsied and diagnosed as basal cell carcinoma by Dr Ernestene Kiel who sent her here for laryngeal lesion evaluation and management. The patient was informed that this would require further treatment.        Records Reviewed:  Office note by Dr Ernestene Kiel 06/02/23 The patient is an 83 year old female who presents with a chief complaint of a previous growth on her vocal cord.  She has been experiencing changes in her voice, which sometimes becomes raspy or numb. She also reports severe coughing when eating or drinking, accompanied by a sensation of something being lodged in her throat. She sought medical attention due to worsening acid reflux, which was confirmed through an EGD and upper GI examination. A lump was discovered on her vocal cord during this procedure. She is currently taking omeprazole 40 mg daily in the afternoon. She also experienced esophagitis, which she initially mistook for a heart attack. Even though she lives alone, she did not call 911 as her breathing was not severely affected. She reports no facial numbness or history of stroke. She has been  dealing with these voice issues for a few months but reports no shortness of breath or stridor.   She has noticed a lump on her nose that has been present for several months. She has no history of skin cancer.  She has a history of Bell's palsy and fibromyalgia.   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 Also with suspected BCC left nose s/p shave biopsy today.  1. Laryngeal mass  2. Nasal mass  3. Gastroesophageal reflux disease without esophagitis  4. LPRD (laryngopharyngeal reflux disease)   RECOMMENDATIONS: Assessment & Plan  I've recommended urgent referral to Laryngologist Dr. Irene Pap for urgent surgical excision of the right laryngeal mass. A CT Neck with contrast will also be ordered to further evaluate the laryngeal anatomy. Pt instructed to head to emergency room for any increasing shortness of breath or stridor.   Emphasized importance of dietary/lifestyle modifications to reduce her poorly controlled GERD symptoms.   Nasal lesion A biopsy of the nasal lump will be performed today to rule out skin cancer. Phone f/u after biopsy. If confirmed will coordinate resection with Mohs Surgery ([facility] surgery center) and I will perform the recon.      Past Medical History:  Diagnosis Date   Acid reflux    Bell's palsy    Chronic pain syndrome    Fibromyalgia    Heart murmur    History of hiatal hernia    Hyperlipidemia    Hypothyroidism     Past Surgical History:  Procedure Laterality Date   APPENDECTOMY     CHOLECYSTECTOMY     DILATION AND CURETTAGE OF UTERUS     multiple   ESOPHAGOGASTRODUODENOSCOPY (EGD) WITH PROPOFOL N/A 11/13/2022   Procedure: ESOPHAGOGASTRODUODENOSCOPY (EGD) WITH PROPOFOL;  Surgeon: Corbin Ade, MD;  Location: [facility];  Service: Endoscopy;  Laterality: N/A;  12:30 pm, ASA 3   hysterectomy     partial and then completed   MALONEY DILATION N/A 11/13/2022   Procedure: MALONEY DILATION;  Surgeon: Corbin Ade, MD;  Location: [facility];  Service: Endoscopy;  Laterality: N/A;   MICROLARYNGOSCOPY WITH CO2 LASER AND EXCISION OF VOCAL CORD LESION  N/A 07/23/2023   Procedure: MICROLARYNGOSCOPY WITH CO2 LASER AND EXCISION OF VOCAL CORD LESION;  Surgeon: Ashok Croon, MD;  Location: [facility];  Service: ENT;  Laterality: N/A;   RIGID BRONCHOSCOPY N/A 07/23/2023   Procedure: RIGID BRONCHOSCOPY;  Surgeon: Ashok Croon, MD;  Location: [facility] OR;  Service: ENT;  Laterality: N/A;   TONSILLECTOMY      Family History  Problem Relation Age of Onset   Colon cancer Neg Hx     Social History:  reports that she quit smoking about 5 years ago. Her smoking use included cigarettes. She has never used smokeless tobacco. She reports that she does not currently use alcohol. She reports that she does not use drugs.  Allergies:  Allergies  Allergen Reactions   Bee Venom Anaphylaxis   Nicorette [Nicotine Polacrilex] Anaphylaxis   Penicillin G Anaphylaxis   Sulfa Antibiotics Anaphylaxis    Medications: I have reviewed the patient's current medications.  The PMH, PSH, Medications, Allergies, and SH were reviewed and updated.  ROS: Constitutional: Negative for fever, weight loss and weight gain. Cardiovascular: Negative for chest pain and dyspnea on exertion. Respiratory: Is not experiencing shortness of breath at rest. Gastrointestinal: Negative for  nausea and vomiting. Neurological: Negative for headaches. Psychiatric: The patient is not nervous/anxious  Pulse 82, SpO2 96%.  PHYSICAL EXAM:  Exam: General: Well-developed, well-nourished Respiratory Respiratory effort: Equal inspiration and expiration without stridor Cardiovascular Peripheral Vascular: Warm extremities with equal color/perfusion Eyes: No nystagmus with equal extraocular motion bilaterally Neuro/Psych/Balance: Patient oriented to person, place, and time; Appropriate mood and affect; Gait is intact with no imbalance; Cranial nerves I-XII are intact Head and Face Inspection: Normocephalic and atraumatic without mass or lesion Palpation: Facial skeleton intact without bony  stepoffs Salivary Glands: No mass or tenderness Facial Strength: Facial motility symmetric and full bilaterally ENT Pinna: External ear intact and fully developed External canal: Canal is patent with intact skin Tympanic Membrane: Clear and mobile TM perforation noted on the left  External Nose: No scar or anatomic deformity Internal Nose: Septum is deviated to the left. No polyp, or purulence. Mucosal edema and erythema present.  Bilateral inferior turbinate hypertrophy.  Lips, Teeth, and gums: Mucosa and teeth intact and viable TMJ: No pain to palpation with full mobility Oral cavity/oropharynx: No erythema or exudate, no lesions present Nasopharynx: No mass or lesion with intact mucosa Hypopharynx: Intact mucosa without pooling of secretions Larynx Glottic: Full true vocal cord mobility, VF atrophy, glottic closure complete and surgical excision site healed completely at this point Supraglottic: Normal appearing epiglottis and AE folds Interarytenoid Space: Moderate pachydermia&edema Subglottic Space: Patent without lesion or edema Neck Neck and Trachea: Midline trachea without mass or lesion Thyroid: No mass or nodularity Lymphatics: No lymphadenopathy  Procedure: Preoperative diagnosis: s/p excision of laryngeal mass and injection augmentation, hx of dysphonia   Postoperative diagnosis:   Same + GERD LPR  Procedure: Flexible fiberoptic laryngoscopy  Findings: Full true vocal cord mobility, VF edges appear straight, surgical site healed completely   Surgeon: Ashok Croon, MD  Anesthesia: Topical lidocaine and Afrin Complications: None Condition is stable throughout exam  Indications and consent:  The patient presents to the clinic with Indirect laryngoscopy view was incomplete. Thus it was recommended that they undergo a flexible fiberoptic laryngoscopy. All of the risks, benefits, and potential complications were reviewed with the patient preoperatively and verbal  informed consent was obtained.  Procedure: The patient was seated upright in the clinic. Topical lidocaine and Afrin were applied to the nasal cavity. After adequate anesthesia had occurred, I then proceeded to pass the flexible telescope into the nasal cavity. The nasal cavity was patent without rhinorrhea or polyp. The nasopharynx was also patent without mass or lesion. The base of tongue was visualized and was normal. There were no signs of pooling of secretions in the piriform sinuses. The true vocal folds were mobile bilaterally. There was moderate interarytenoid pachydermia and post cricoid edema. The telescope was then slowly withdrawn and the patient tolerated the procedure throughout.  Studies Reviewed: 11/13/22 EGD      Pathology result  FINAL MICROSCOPIC DIAGNOSIS:   A. CYST, RIGHT FALSE FOLD, EXCISION:       Benign oncocytic cyst.       Negative for atypia or malignancy.     Assessment/Plan: Encounter Diagnoses  Name Primary?   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 Seen on flexible scope exam today appears to be attached to the right anterior false  fold vs vestibule with ball-valve effect. Lesion is smooth and round, likely a cyst. Requires removal to confirm diagnosis and alleviate symptoms. Discussed DML with CO2 laser surgery endoscopic approach, with anticipated outcomes including improved voice and swallowing. Risks include minor bleeding, minimized by the laser's cauterizing effect. Surgery under general anesthesia with intubation vs jet, will assess on the day of procedure. Preoperative clearance required due to age. CT scan of the neck ordered to assess lesion's extent and origin. Post-surgery swallow study to ensure safe swallowing 2/2 dysphagia sx. -  Order neck CT stat - Schedule for DML bronch and CO2 laser excision  - Ordered preoperative clearance by PCP - Schedule post-surgery swallow study  Chronic dysphagia particularly with pills and coughing with meals  - MBS esophagram after surgery   Basal Cell Carcinoma of the Nose Biopsy confirmed basal cell carcinoma. Requires further treatment and excision. She will see Dr Ernestene Kiel for management. 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 Reports hearing difficulties. Encouraged regular use of hearing aids to improve hearing function.TM perf on exam, unclear if any effect on hearing  - Encourage regular use of hearing aids - Audiogram in the future   Bell's Palsy R side  Diagnosed 15 years ago. Residual symptoms include occasional drooling and difficulty using straws. No new interventions required. Complete eye closure  - No new interventions required  Follow-up -  neck CT - scheduled 06/12/23 - Follow up with preauthorization team for CT scan - Schedule follow-up appointment post-surgery.      Update 08/07/2023 Assessment and Plan    Postoperative Follow-up for Oncocytic Cyst Removal Status post removal of a right false fold mass with pathology (+)  oncocytic cyst. No throat discomfort reported post-op. 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 We discussed that the filler expected to last at least a year. - Monitor vocal cord function and voice quality - Discussed potential for future filler injections if needed  Nasal Lesion/BCCa Nasal lesion scheduled for Moh's surgery removal by Dr. Lequita Halt, with subsequent reconstruction with Dr Ernestene Kiel. Patient  comfortable with the plan and anesthesia. - Proceed with scheduled nasal lesion excision on August 19, 2023  RTC as needed    Ashok Croon, MD Otolaryngology [facility] Phone: [phone] Fax: [phone]    08/07/2023, 1:59 PM

## 2023-08-19 ENCOUNTER — Other Ambulatory Visit: Payer: Self-pay

## 2023-08-19 ENCOUNTER — Encounter (HOSPITAL_COMMUNITY): Payer: Self-pay | Admitting: Otolaryngology

## 2023-08-20 ENCOUNTER — Ambulatory Visit (HOSPITAL_COMMUNITY)
Admission: RE | Admit: 2023-08-20 | Discharge: 2023-08-20 | Disposition: A | Discharge status: Home / Self Care | Payer: Medicare PPO | Attending: Otolaryngology | Admitting: Otolaryngology

## 2023-08-20 ENCOUNTER — Ambulatory Visit (HOSPITAL_COMMUNITY): Payer: Medicare PPO | Admitting: Anesthesiology

## 2023-08-20 ENCOUNTER — Encounter (HOSPITAL_COMMUNITY): Payer: Self-pay | Admitting: Otolaryngology

## 2023-08-20 ENCOUNTER — Encounter (HOSPITAL_COMMUNITY)
Admission: RE | Disposition: A | Discharge status: Home / Self Care | Payer: Self-pay | Source: Home / Self Care | Attending: Otolaryngology

## 2023-08-20 ENCOUNTER — Other Ambulatory Visit: Payer: Self-pay

## 2023-08-20 ENCOUNTER — Ambulatory Visit (HOSPITAL_BASED_OUTPATIENT_CLINIC_OR_DEPARTMENT_OTHER): Payer: Medicare PPO | Admitting: Anesthesiology

## 2023-08-20 DIAGNOSIS — E039 Hypothyroidism, unspecified: Secondary | ICD-10-CM | POA: Diagnosis not present

## 2023-08-20 DIAGNOSIS — Z87891 Personal history of nicotine dependence: Secondary | ICD-10-CM | POA: Insufficient documentation

## 2023-08-20 DIAGNOSIS — M797 Fibromyalgia: Secondary | ICD-10-CM | POA: Insufficient documentation

## 2023-08-20 DIAGNOSIS — Z428 Encounter for other plastic and reconstructive surgery following medical procedure or healed injury: Secondary | ICD-10-CM | POA: Insufficient documentation

## 2023-08-20 DIAGNOSIS — Z85828 Personal history of other malignant neoplasm of skin: Secondary | ICD-10-CM | POA: Diagnosis not present

## 2023-08-20 DIAGNOSIS — Z79899 Other long term (current) drug therapy: Secondary | ICD-10-CM | POA: Diagnosis not present

## 2023-08-20 DIAGNOSIS — I10 Essential (primary) hypertension: Secondary | ICD-10-CM | POA: Diagnosis not present

## 2023-08-20 DIAGNOSIS — K219 Gastro-esophageal reflux disease without esophagitis: Secondary | ICD-10-CM | POA: Insufficient documentation

## 2023-08-20 DIAGNOSIS — K449 Diaphragmatic hernia without obstruction or gangrene: Secondary | ICD-10-CM | POA: Diagnosis not present

## 2023-08-20 DIAGNOSIS — C44311 Basal cell carcinoma of skin of nose: Secondary | ICD-10-CM

## 2023-08-20 DIAGNOSIS — M950 Acquired deformity of nose: Secondary | ICD-10-CM | POA: Diagnosis present

## 2023-08-20 HISTORY — PX: SKIN FULL THICKNESS GRAFT: SHX442

## 2023-08-20 HISTORY — DX: Malignant (primary) neoplasm, unspecified: C80.1

## 2023-08-20 HISTORY — PX: EXCISION MASS HEAD: SHX6702

## 2023-08-20 HISTORY — PX: NASAL FLAP ROTATION: SHX5366

## 2023-08-20 LAB — POCT I-STAT, CHEM 8
BUN: 14 mg/dL (ref 8–23)
Calcium, Ion: 1.17 mmol/L (ref 1.15–1.40)
Chloride: 105 mmol/L (ref 98–111)
Creatinine, Ser: 1 mg/dL (ref 0.44–1.00)
Glucose, Bld: 93 mg/dL (ref 70–99)
HCT: 41 % (ref 36.0–46.0)
Hemoglobin: 13.9 g/dL (ref 12.0–15.0)
Potassium: 4.1 mmol/L (ref 3.5–5.1)
Sodium: 141 mmol/L (ref 135–145)
TCO2: 28 mmol/L (ref 22–32)

## 2023-08-20 LAB — NO BLOOD PRODUCTS

## 2023-08-20 SURGERY — EXCISION, MASS, HEAD
Anesthesia: General | Site: Nose | Laterality: Left

## 2023-08-20 MED ORDER — MUPIROCIN 2 % EX OINT: 1.0000 | TOPICAL_OINTMENT | Freq: Two times a day (BID) | CUTANEOUS | 2 refills | Status: AC

## 2023-08-20 MED ORDER — LIDOCAINE 2% (20 MG/ML) 5 ML SYRINGE
INTRAMUSCULAR | Status: DC | PRN
Start: 2023-08-20 — End: 2023-08-20
  Administered 2023-08-20: 20 mg via INTRAVENOUS

## 2023-08-20 MED ORDER — ROCURONIUM BROMIDE 10 MG/ML (PF) SYRINGE
PREFILLED_SYRINGE | INTRAVENOUS | Status: DC | PRN
Start: 2023-08-20 — End: 2023-08-20
  Administered 2023-08-20: 50 mg via INTRAVENOUS

## 2023-08-20 MED ORDER — MIDAZOLAM HCL 2 MG/2ML IJ SOLN: 0.5000 mg | Freq: Once | INTRAMUSCULAR | Status: DC | PRN

## 2023-08-20 MED ORDER — MEPERIDINE HCL 25 MG/ML IJ SOLN: 6.2500 mg | INTRAMUSCULAR | Status: DC | PRN

## 2023-08-20 MED ORDER — BSS IO SOLN
INTRAOCULAR | Status: DC | PRN
  Administered 2023-08-20: 15 mL

## 2023-08-20 MED ORDER — BSS IO SOLN
INTRAOCULAR | Status: AC
  Filled 2023-08-20: qty 15

## 2023-08-20 MED ORDER — ACETAMINOPHEN 500 MG PO TABS
1000.0000 mg | ORAL_TABLET | Freq: Once | ORAL | Status: AC
  Administered 2023-08-20: 1000 mg via ORAL
  Filled 2023-08-20: qty 2

## 2023-08-20 MED ORDER — ROCURONIUM BROMIDE 10 MG/ML (PF) SYRINGE
PREFILLED_SYRINGE | INTRAVENOUS | Status: AC
  Filled 2023-08-20: qty 10

## 2023-08-20 MED ORDER — FENTANYL CITRATE (PF) 100 MCG/2ML IJ SOLN
25.0000 ug | INTRAMUSCULAR | Status: DC | PRN
  Administered 2023-08-20: 50 ug via INTRAVENOUS

## 2023-08-20 MED ORDER — LIDOCAINE-EPINEPHRINE 1 %-1:100000 IJ SOLN
INTRAMUSCULAR | Status: AC
  Filled 2023-08-20: qty 1

## 2023-08-20 MED ORDER — CLINDAMYCIN PHOSPHATE 900 MG/50ML IV SOLN
900.0000 mg | INTRAVENOUS | Status: AC
  Administered 2023-08-20: 900 mg via INTRAVENOUS
  Filled 2023-08-20: qty 50

## 2023-08-20 MED ORDER — LACTATED RINGERS IV SOLN
INTRAVENOUS | Status: DC
Start: 2023-08-20 — End: 2023-08-20

## 2023-08-20 MED ORDER — PROPOFOL 500 MG/50ML IV EMUL
INTRAVENOUS | Status: DC | PRN
  Administered 2023-08-20: 125 ug/kg/min via INTRAVENOUS

## 2023-08-20 MED ORDER — CHLORHEXIDINE GLUCONATE 0.12 % MT SOLN
OROMUCOSAL | Status: AC
  Administered 2023-08-20: 15 mL via OROMUCOSAL
  Filled 2023-08-20: qty 15

## 2023-08-20 MED ORDER — DEXAMETHASONE SODIUM PHOSPHATE 10 MG/ML IJ SOLN
INTRAMUSCULAR | Status: DC | PRN
Start: 2023-08-20 — End: 2023-08-20
  Administered 2023-08-20: 10 mg via INTRAVENOUS

## 2023-08-20 MED ORDER — FENTANYL CITRATE (PF) 100 MCG/2ML IJ SOLN
INTRAMUSCULAR | Status: AC
  Filled 2023-08-20: qty 2

## 2023-08-20 MED ORDER — BACITRACIN ZINC 500 UNIT/GM EX OINT
TOPICAL_OINTMENT | CUTANEOUS | Status: DC | PRN
  Administered 2023-08-20: 1 via TOPICAL

## 2023-08-20 MED ORDER — DEXAMETHASONE SODIUM PHOSPHATE 10 MG/ML IJ SOLN
INTRAMUSCULAR | Status: AC
  Filled 2023-08-20: qty 1

## 2023-08-20 MED ORDER — OXYCODONE HCL 5 MG/5ML PO SOLN: 5.0000 mg | Freq: Once | ORAL | Status: DC | PRN

## 2023-08-20 MED ORDER — PHENYLEPHRINE 80 MCG/ML (10ML) SYRINGE FOR IV PUSH (FOR BLOOD PRESSURE SUPPORT)
PREFILLED_SYRINGE | INTRAVENOUS | Status: AC
  Filled 2023-08-20: qty 10

## 2023-08-20 MED ORDER — 0.9 % SODIUM CHLORIDE (POUR BTL) OPTIME
TOPICAL | Status: DC | PRN
  Administered 2023-08-20: 1000 mL

## 2023-08-20 MED ORDER — ONDANSETRON HCL 4 MG/2ML IJ SOLN
INTRAMUSCULAR | Status: AC
  Filled 2023-08-20: qty 2

## 2023-08-20 MED ORDER — LIDOCAINE-EPINEPHRINE 1 %-1:100000 IJ SOLN
INTRAMUSCULAR | Status: DC | PRN
  Administered 2023-08-20: 6 mL

## 2023-08-20 MED ORDER — ONDANSETRON HCL 4 MG/2ML IJ SOLN
INTRAMUSCULAR | Status: DC | PRN
  Administered 2023-08-20: 4 mg via INTRAVENOUS

## 2023-08-20 MED ORDER — FENTANYL CITRATE (PF) 250 MCG/5ML IJ SOLN
INTRAMUSCULAR | Status: AC
  Filled 2023-08-20: qty 5

## 2023-08-20 MED ORDER — PHENYLEPHRINE HCL-NACL 20-0.9 MG/250ML-% IV SOLN
INTRAVENOUS | Status: DC | PRN
  Administered 2023-08-20: 30 ug/min via INTRAVENOUS

## 2023-08-20 MED ORDER — PROPOFOL 10 MG/ML IV BOLUS
INTRAVENOUS | Status: AC
  Filled 2023-08-20: qty 20

## 2023-08-20 MED ORDER — SUGAMMADEX SODIUM 200 MG/2ML IV SOLN
INTRAVENOUS | Status: DC | PRN
  Administered 2023-08-20: 200 mg via INTRAVENOUS

## 2023-08-20 MED ORDER — SURGIFLO WITH THROMBIN (HEMOSTATIC MATRIX KIT) OPTIME
TOPICAL | Status: DC | PRN
  Administered 2023-08-20: 1 via TOPICAL

## 2023-08-20 MED ORDER — CHLORHEXIDINE GLUCONATE 0.12 % MT SOLN: 15.0000 mL | Freq: Once | OROMUCOSAL | Status: AC

## 2023-08-20 MED ORDER — FENTANYL CITRATE (PF) 250 MCG/5ML IJ SOLN
INTRAMUSCULAR | Status: DC | PRN
  Administered 2023-08-20: 50 ug via INTRAVENOUS

## 2023-08-20 MED ORDER — PHENYLEPHRINE 80 MCG/ML (10ML) SYRINGE FOR IV PUSH (FOR BLOOD PRESSURE SUPPORT)
PREFILLED_SYRINGE | INTRAVENOUS | Status: DC | PRN
  Administered 2023-08-20: 240 ug via INTRAVENOUS
  Administered 2023-08-20: 80 ug via INTRAVENOUS

## 2023-08-20 MED ORDER — LIDOCAINE 2% (20 MG/ML) 5 ML SYRINGE
INTRAMUSCULAR | Status: AC
  Filled 2023-08-20: qty 5

## 2023-08-20 MED ORDER — OXYCODONE HCL 5 MG PO TABS: 5.0000 mg | ORAL_TABLET | Freq: Once | ORAL | Status: DC | PRN

## 2023-08-20 MED ORDER — PROPOFOL 10 MG/ML IV BOLUS
INTRAVENOUS | Status: DC | PRN
  Administered 2023-08-20: 50 mg via INTRAVENOUS
  Administered 2023-08-20: 100 mg via INTRAVENOUS

## 2023-08-20 SURGICAL SUPPLY — 45 items
APPLICATOR DR MATTHEWS STRL (MISCELLANEOUS) ×2 IMPLANT
BETADINE 5% OPHTHALMIC (OPHTHALMIC) ×4 IMPLANT
BLADE SURG 15 STRL LF DISP TIS (BLADE) ×2 IMPLANT
CANISTER SUCT 3000ML PPV (MISCELLANEOUS) ×2 IMPLANT
CLEANER TIP ELECTROSURG 2X2 (MISCELLANEOUS) ×2 IMPLANT
CORD BIPOLAR FORCEPS 12FT (ELECTRODE) IMPLANT
COVER SURGICAL LIGHT HANDLE (MISCELLANEOUS) ×2 IMPLANT
DRAPE HALF SHEET 40X57 (DRAPES) ×2 IMPLANT
DRSG TEGADERM 2-3/8X2-3/4 SM (GAUZE/BANDAGES/DRESSINGS) ×2 IMPLANT
DRSG TELFA 3X8 NADH STRL (GAUZE/BANDAGES/DRESSINGS) ×2 IMPLANT
ELECT COATED BLADE 2.86 ST (ELECTRODE) ×2 IMPLANT
ELECT NDL BLADE 2-5/6 (NEEDLE) ×2 IMPLANT
ELECT NEEDLE BLADE 2-5/6 (NEEDLE) IMPLANT
ELECT REM PT RETURN 9FT ADLT (ELECTROSURGICAL) ×2 IMPLANT
ELECTRODE REM PT RTRN 9FT ADLT (ELECTROSURGICAL) ×2 IMPLANT
GAUZE KITTNER 1.5X5 (MISCELLANEOUS) IMPLANT
GAUZE XEROFORM 1X8 LF (GAUZE/BANDAGES/DRESSINGS) ×2 IMPLANT
GLOVE BIO SURGEON STRL SZ7.5 (GLOVE) ×2 IMPLANT
GLOVE BIOGEL PI IND STRL 8 (GLOVE) ×2 IMPLANT
GOWN STRL REUS W/ TWL LRG LVL3 (GOWN DISPOSABLE) ×2 IMPLANT
GOWN STRL REUS W/ TWL XL LVL3 (GOWN DISPOSABLE) ×2 IMPLANT
KIT BASIN OR (CUSTOM PROCEDURE TRAY) ×2 IMPLANT
KIT TURNOVER KIT B (KITS) ×2 IMPLANT
MARKER SKIN DUAL TIP RULER LAB (MISCELLANEOUS) ×2 IMPLANT
NDL HYPO 30X.5 LL (NEEDLE) ×2 IMPLANT
NDL PRECISIONGLIDE 27X1.5 (NEEDLE) ×2 IMPLANT
NEEDLE HYPO 30X.5 LL (NEEDLE) ×2 IMPLANT
NEEDLE PRECISIONGLIDE 27X1.5 (NEEDLE) ×4 IMPLANT
NS IRRIG 1000ML POUR BTL (IV SOLUTION) ×2 IMPLANT
PAD ARMBOARD 7.5X6 YLW CONV (MISCELLANEOUS) ×4 IMPLANT
PENCIL SMOKE EVACUATOR (MISCELLANEOUS) ×2 IMPLANT
SPONGE T-LAP 18X18 ~~LOC~~+RFID (SPONGE) ×2 IMPLANT
SURGIFLO W/THROMBIN 8M KIT (HEMOSTASIS) IMPLANT
SUT CHROMIC 4 0 RB 1X27 (SUTURE) IMPLANT
SUT ETHILON 5 0 PS 2 18 (SUTURE) IMPLANT
SUT ETHILON 6 0 P 1 (SUTURE) IMPLANT
SUT MON AB 5-0 P3 18 (SUTURE) IMPLANT
SUT PDS PLUS AB 5-0 RB-1 (SUTURE) IMPLANT
SUT SILK 2 0 SH (SUTURE) IMPLANT
SUT VIC AB 4-0 PS2 18 (SUTURE) IMPLANT
SYR CONTROL 10ML LL (SYRINGE) ×2 IMPLANT
TOWEL GREEN STERILE FF (TOWEL DISPOSABLE) ×2 IMPLANT
TRAY ENT MC OR (CUSTOM PROCEDURE TRAY) ×2 IMPLANT
TUBE CONNECTING 12X1/4 (SUCTIONS) ×2 IMPLANT
WATER STERILE IRR 1000ML POUR (IV SOLUTION) ×2 IMPLANT

## 2023-08-20 NOTE — Anesthesia Procedure Notes (Signed)
 Procedure Name: Intubation Date/Time: 08/20/2023 12:35 PM  Performed by: Kayleen Memos, CRNAPre-anesthesia Checklist: Patient identified, Emergency Drugs available, Suction available and Patient being monitored Patient Re-evaluated:Patient Re-evaluated prior to induction Oxygen Delivery Method: Circle System Utilized Preoxygenation: Pre-oxygenation with 100% oxygen Induction Type: IV induction Ventilation: Mask ventilation without difficulty and Oral airway inserted - appropriate to patient size Laryngoscope Size: Mac and 3 Tube type: Oral Tube size: 7.0 mm Number of attempts: 1 Airway Equipment and Method: Stylet and Oral airway Placement Confirmation: ETT inserted through vocal cords under direct vision, positive ETCO2 and breath sounds checked- equal and bilateral Secured at: 22 cm Tube secured with: Tape Dental Injury: Teeth and Oropharynx as per pre-operative assessment

## 2023-08-20 NOTE — Anesthesia Preprocedure Evaluation (Addendum)
 Anesthesia Evaluation  Patient identified by MRN, date of birth, ID band Patient awake    Reviewed: Allergy & Precautions, NPO status , Patient's Chart, lab work & pertinent test results  History of Anesthesia Complications Negative for: history of anesthetic complications  Airway Mallampati: I  TM Distance: >3 FB Neck ROM: Full    Dental  (+) Edentulous Upper, Edentulous Lower   Pulmonary former smoker   breath sounds clear to auscultation       Cardiovascular hypertension, Pt. on medications (-) angina  Rhythm:Regular Rate:Normal  08/2022 ECHO:  The left ventricular size is normal.  Left ventricular systolic function is normal.  LV ejection fraction = 60-65%.  The right ventricle is normal in size and function.  Injection of agitated saline showed no right-to-left shunt.  There is aortic valve sclerosis, without stenosis.  There is mild to moderate aortic regurgitation.     Neuro/Psych H/o Bell's palsy    GI/Hepatic Neg liver ROS, hiatal hernia,GERD  Medicated and Controlled,,  Endo/Other  Hypothyroidism  BMI 29.8  Renal/GU negative Renal ROS     Musculoskeletal  (+)  Fibromyalgia -  Abdominal   Peds  Hematology negative hematology ROS (+)   Anesthesia Other Findings   Reproductive/Obstetrics                             Anesthesia Physical Anesthesia Plan  ASA: 3  Anesthesia Plan: General   Post-op Pain Management: Tylenol PO (pre-op)* and Minimal or no pain anticipated   Induction: Intravenous  PONV Risk Score and Plan: 2 and Ondansetron, Dexamethasone and Treatment may vary due to age or medical condition  Airway Management Planned: Oral ETT  Additional Equipment: None  Intra-op Plan:   Post-operative Plan: Extubation in OR  Informed Consent: I have reviewed the patients History and Physical, chart, labs and discussed the procedure including the risks, benefits and  alternatives for the proposed anesthesia with the patient or authorized representative who has indicated his/her understanding and acceptance.     Dental advisory given  Plan Discussed with: CRNA and Surgeon  Anesthesia Plan Comments:         Anesthesia Quick Evaluation

## 2023-08-20 NOTE — Anesthesia Postprocedure Evaluation (Signed)
 Anesthesia Post Note  Patient: Oluwaseun Bruyere  Procedure(s) Performed: EXCISION AND REPAIR OF MOHS DEFECT ON LEFT NOSE, WITH 4x1.2cm INTERPOLATED PEDICLED NASOLABIAL FLAP (Left: Nose) AURICULAR CARTILAGE HARVEST 20x14mm (Left: Ear) NASOLABIAL FLAP (Left: Nose)     Patient location during evaluation: PACU Anesthesia Type: General Level of consciousness: awake and alert, patient cooperative and oriented Pain management: pain level controlled Vital Signs Assessment: post-procedure vital signs reviewed and stable Respiratory status: spontaneous breathing, nonlabored ventilation and respiratory function stable Cardiovascular status: blood pressure returned to baseline and stable Postop Assessment: no apparent nausea or vomiting Anesthetic complications: no   No notable events documented.  Last Vitals:  Vitals:   08/20/23 1430 08/20/23 1445  BP: (!) 151/77 (!) 153/62  Pulse: 76 75  Resp: 18 (!) 21  Temp:    SpO2: 96% 90%    Last Pain:  Vitals:   08/20/23 1445  PainSc: 3                  Makayia Duplessis,E. Andreyah Natividad

## 2023-08-20 NOTE — Progress Notes (Signed)
 Fentanyl wasted in stericycle with Jackelyn Knife, RN.

## 2023-08-20 NOTE — Op Note (Signed)
 [facility] OPERATIVE NOTE  [facility] Date/Time of Admission: 08/20/2023  9:40 AM  CSN: [id number];FUX:[id number] Attending Provider: Scarlette Ar, MD Room/Bed: MCPO/NONE DOB: [birth year] Age: 83 y.o.   Pre-Op Diagnosis: Basal cell carcinoma of skin of nose; Acquired deformity of nose  Post-Op Diagnosis: Basal cell carcinoma of skin of nose; Acquired deformity of nose  Procedure: Repair mohs defect left nose with 4x.1.2cm interpolated pedicled nasolabial flap (CPT 15576) Auricular cartilage harvest 20x60mm (left ear to nose) (CPT 21235) Excision wound head <100cm2 in preparation for flap and graft (CPT 15004)  Anesthesia: General  Surgeon(s): Mervin Kung, MD  Staff: Circulator: Tawny Hopping, RN Scrub Person: Coralee North T  Implants: * No implants in log *  Specimens: * No specimens in log *  Complications: none  EBL: 30 ML  IVF: Per anesthesia ML  Condition: stable  Operative Findings:  Mohs defect left nasal tip and alar lobule ~12x41mm with partial resection of lower lateral cartilage   Description of Operation:  The patient was identified in the preoperative area and consent confirmed in the chart.  She was brought to the operating room by the anesthetist and a preoperative huddle was performed confirming the patient's identity and procedure to be performed.  Once all were in agreement we proceeded with surgery.  General anesthesia was induced and the patient was intubated with an oral endotracheal tube.  Tube was secured the patient's bed was turned 90 degrees from anesthesia.  The patient's bandages were removed and the wounds were examined demonstrating the findings as noted above.  Due to the complex reconstruction and interpolated pedicled nasolabial flap was required with auricular cartilage.  The nose cheek and ear were anesthetized with local anesthetic.  The patient was prepped and draped in standard sterile fashion for  procedure of this kind.  I proceeded with excision wound head less than 100 cm (the wound was 18 x 12 mm).  the nasal wound bed was examined and sharp dissection was performed in a sub-SMAS plane in preparation for inset of the cartilage graft and the flap.  Edges were sharply debrided.  Hemostasis was achieved.  Next I proceeded with left auricular cartilage graft.  A 15 blade was used to make 2 cm incision just inside the antihelical rim.  A subperichondrial plane was dissected sharply.  A 2 cm x 8 mm auricular cartilage graft was then harvested carefully.  Hemostasis was confirmed.  The wound was closed with running 4-0 Chromic Gut sutures.  A ear bolster was placed using a 2-0 silk suture.  Next the auricular cartilage graft was inset.  A precise pocket was dissected from the dome of the left lower lateral cartilage into the alar lobule nonanatomic region.  The auricular cartilage graft was then inset with interrupted 5-0 PDS sutures to the lower lateral cartilage remnant.  Next an interpolated pedicled nasolabial flap from the left cheek was then marked out approximately 4 cm x 12 mm with preservation of the nasal facial isthmus.  The flap was dissected with a 15 blade in a subcutaneous plane with preservation of a broad pedicle.  The flap was gently debulk of excessive subcutaneous tissue and inset into the nose with combination of buried interrupted 5-0 Monocryl sutures for dermal closure interrupted 4-0 Chromic Gut sutures for the alar margin and interrupted 6-0 nylon for the remaining skin.  There was good capillary refill and perfusion at the end of the inset.  The pedicle was dressed with Xeroform, Surgiflo hemostatic  matrix, bacitracin, Telfa, brown paper tape.  The patient was then turned back to anesthesia who extubated her and brought her to the recovery room in stable condition.  All counts were correct and final.   Mervin Kung, MD [facility]  08/20/2023

## 2023-08-20 NOTE — H&P (Signed)
 Peggy Graves is an 83 y.o. female.    Chief Complaint:  Acquired nasal deformity  HPI: Patient presents today for planned elective procedure.  He/she denies any interval change in history since office visit on 06/10/23.  Past Medical History:  Diagnosis Date   Acid reflux    Bell's palsy    Cancer (HCC)    Chronic pain syndrome    Fibromyalgia    Heart murmur    History of hiatal hernia    Hyperlipidemia    Hypothyroidism     Past Surgical History:  Procedure Laterality Date   APPENDECTOMY     CHOLECYSTECTOMY     DILATION AND CURETTAGE OF UTERUS     multiple   ESOPHAGOGASTRODUODENOSCOPY (EGD) WITH PROPOFOL N/A 11/13/2022   Procedure: ESOPHAGOGASTRODUODENOSCOPY (EGD) WITH PROPOFOL;  Surgeon: Corbin Ade, MD;  [facility];  Service: Endoscopy;  Laterality: N/A;  12:30 pm, ASA 3   hysterectomy     partial and then completed   MALONEY DILATION N/A 11/13/2022   Procedure: MALONEY DILATION;  Surgeon: Corbin Ade, MD;  [facility]: [facility];  Service: Endoscopy;  Laterality: N/A;   MICROLARYNGOSCOPY WITH CO2 LASER AND EXCISION OF VOCAL CORD LESION N/A 07/23/2023   Procedure: MICROLARYNGOSCOPY WITH CO2 LASER AND EXCISION OF VOCAL CORD LESION;  Surgeon: Ashok Croon, MD;  Location: [facility];  Service: ENT;  Laterality: N/A;   RIGID BRONCHOSCOPY N/A 07/23/2023   Procedure: RIGID BRONCHOSCOPY;  Surgeon: Ashok Croon, MD;  Location: [facility];  Service: ENT;  Laterality: N/A;   TONSILLECTOMY      Family History  Problem Relation Age of Onset   Colon cancer Neg Hx     Social History:  reports that she quit smoking about 5 years ago. Her smoking use included cigarettes. She has never used smokeless tobacco. She reports that she does not currently use alcohol. She reports that she does not use drugs.  Allergies:  Allergies  Allergen Reactions   Bee Venom Anaphylaxis   Nicorette [Nicotine Polacrilex] Anaphylaxis   Penicillin G Anaphylaxis   Sulfa Antibiotics  Anaphylaxis    Medications Prior to Admission  Medication Sig Dispense Refill   aspirin 325 MG tablet Take 325 mg by mouth every other day. In the morning     Cholecalciferol (VITAMIN D) 50 MCG (2000 UT) tablet Take 2,000 Units by mouth in the morning.     Cyanocobalamin (B-12 PO) Take 2,000 mcg by mouth in the morning.     ezetimibe (ZETIA) 10 MG tablet Take 10 mg by mouth in the morning.     furosemide (LASIX) 20 MG tablet Take 20 mg by mouth in the morning.     HYDROcodone-acetaminophen (NORCO/VICODIN) 5-325 MG tablet Take 1 tablet by mouth every 6 (six) hours as needed for moderate pain.     levothyroxine (SYNTHROID) 100 MCG tablet Take 100 mcg by mouth daily before breakfast.     magnesium oxide (MAG-OX) 400 (240 Mg) MG tablet Take 400 mg by mouth in the morning.     meloxicam (MOBIC) 7.5 MG tablet Take 7.5 mg by mouth daily.     omeprazole (PRILOSEC) 40 MG capsule Take 1 capsule (40 mg total) by mouth daily. 30 capsule 11   OVER THE COUNTER MEDICATION Take 1 capsule by mouth in the morning.  del-Immune V Defense All-Natural Advanced Immune Support     oxybutynin (DITROPAN) 5 MG tablet Take 5 mg by mouth in the morning.     EPINEPHrine (EPIPEN  2-PAK) 0.3 mg/0.3 mL IJ SOAJ injection Inject 0.3 mg into the muscle as needed for anaphylaxis.      Results for orders placed or performed during the hospital encounter of 08/20/23 (from the past 48 hours)  I-STAT, chem 8     Status: None   Collection Time: 08/20/23 10:38 AM  Result Value Ref Range   Sodium 141 135 - 145 mmol/L   Potassium 4.1 3.5 - 5.1 mmol/L   Chloride 105 98 - 111 mmol/L   BUN 14 8 - 23 mg/dL   Creatinine, Ser 0.63 0.44 - 1.00 mg/dL   Glucose, Bld 93 70 - 99 mg/dL    Comment: Glucose reference range applies only to samples taken after fasting for at least 8 hours.   Calcium, Ion 1.17 1.15 - 1.40 mmol/L   TCO2 28 22 - 32 mmol/L   Hemoglobin 13.9 12.0 - 15.0 g/dL   HCT 01.6 01.0 - 93.2 %   No results found.  ROS:  negative other than stated in HPI  Blood pressure (!) 157/55, pulse 82, temperature 98.2 F (36.8 C), resp. rate 18, height 5\' 2"  (1.575 m), weight 73.9 kg, SpO2 99%.  PHYSICAL EXAM: General: Resting comfortably in NAD  Lungs: Non-labored respiratinos  Studies Reviewed: None   Assessment/Plan BCC nose Acquired deformity of nose  Discussed reconstructive plan - specifically an interpolated pedicled left nasolabial flap with possible L ear cartilage graft. RBA discussed.     Electronically signed by:  Scarlette Ar, MD  Staff Physician Facial Plastic & Reconstructive Surgery Otolaryngology - Head and Neck Surgery [facility] - [facility]  08/20/2023, 11:04 AM

## 2023-08-20 NOTE — Transfer of Care (Signed)
 Immediate Anesthesia Transfer of Care Note  Patient: Peggy Graves  Procedure(s) Performed: EXCISION AND REPAIR OF MOHS DEFECT ON LEFT NOSE (Left) ADJACENT TISSUE TRANSFER; POSSIBLE SKIN GRAFT FROM EAR OR FOREHEAD; POSSIBLE EAR CARTILAGE GRAFT (Left) POSSIBLE NASOLABIAL FLAP; POSSIBLE PARAMEDIAN FOREHEAD FLAP (Left)  Patient Location: PACU  Anesthesia Type:General  Level of Consciousness: drowsy  Airway & Oxygen Therapy: Patient Spontanous Breathing and Patient connected to face mask oxygen  Post-op Assessment: Report given to RN, Post -op Vital signs reviewed and stable, and Patient moving all extremities  Post vital signs: Reviewed and stable  Last Vitals:  Vitals Value Taken Time  BP 169/48 08/20/23 1413  Temp    Pulse 78 08/20/23 1415  Resp 21 08/20/23 1415  SpO2 94 % 08/20/23 1415  Vitals shown include unfiled device data.  Last Pain:  Vitals:   08/20/23 1034  PainSc: 0-No pain         Complications: No notable events documented.

## 2023-08-20 NOTE — Discharge Instructions (Signed)

## 2023-08-21 ENCOUNTER — Encounter (HOSPITAL_COMMUNITY): Payer: Self-pay | Admitting: Otolaryngology

## 2023-09-16 ENCOUNTER — Encounter (HOSPITAL_BASED_OUTPATIENT_CLINIC_OR_DEPARTMENT_OTHER): Payer: Self-pay | Admitting: Otolaryngology

## 2023-09-16 ENCOUNTER — Other Ambulatory Visit: Payer: Self-pay

## 2023-09-16 ENCOUNTER — Other Ambulatory Visit: Payer: Self-pay | Admitting: Otolaryngology

## 2023-09-24 ENCOUNTER — Ambulatory Visit (HOSPITAL_BASED_OUTPATIENT_CLINIC_OR_DEPARTMENT_OTHER)
Admission: RE | Admit: 2023-09-24 | Discharge: 2023-09-24 | Disposition: A | Discharge status: Home / Self Care | Attending: Otolaryngology | Admitting: Otolaryngology

## 2023-09-24 ENCOUNTER — Ambulatory Visit (HOSPITAL_BASED_OUTPATIENT_CLINIC_OR_DEPARTMENT_OTHER): Admitting: Anesthesiology

## 2023-09-24 ENCOUNTER — Other Ambulatory Visit: Payer: Self-pay

## 2023-09-24 ENCOUNTER — Encounter (HOSPITAL_BASED_OUTPATIENT_CLINIC_OR_DEPARTMENT_OTHER): Payer: Self-pay | Admitting: Otolaryngology

## 2023-09-24 ENCOUNTER — Encounter (HOSPITAL_BASED_OUTPATIENT_CLINIC_OR_DEPARTMENT_OTHER)
Admission: RE | Disposition: A | Discharge status: Home / Self Care | Payer: Self-pay | Source: Home / Self Care | Attending: Otolaryngology

## 2023-09-24 DIAGNOSIS — M797 Fibromyalgia: Secondary | ICD-10-CM | POA: Diagnosis not present

## 2023-09-24 DIAGNOSIS — M950 Acquired deformity of nose: Secondary | ICD-10-CM | POA: Insufficient documentation

## 2023-09-24 DIAGNOSIS — E039 Hypothyroidism, unspecified: Secondary | ICD-10-CM | POA: Diagnosis not present

## 2023-09-24 DIAGNOSIS — Z79890 Hormone replacement therapy (postmenopausal): Secondary | ICD-10-CM | POA: Diagnosis not present

## 2023-09-24 DIAGNOSIS — I10 Essential (primary) hypertension: Secondary | ICD-10-CM | POA: Insufficient documentation

## 2023-09-24 DIAGNOSIS — Z79899 Other long term (current) drug therapy: Secondary | ICD-10-CM | POA: Diagnosis not present

## 2023-09-24 DIAGNOSIS — Z87891 Personal history of nicotine dependence: Secondary | ICD-10-CM | POA: Insufficient documentation

## 2023-09-24 DIAGNOSIS — C44311 Basal cell carcinoma of skin of nose: Secondary | ICD-10-CM

## 2023-09-24 DIAGNOSIS — Z01818 Encounter for other preprocedural examination: Secondary | ICD-10-CM

## 2023-09-24 HISTORY — PX: NASAL FLAP ROTATION: SHX5366

## 2023-09-24 SURGERY — CREATION, FLAP, PEDICLED ROTATION, FOREHEAD AND NOSE
Anesthesia: General | Site: Nose | Laterality: Left

## 2023-09-24 MED ORDER — LIDOCAINE-EPINEPHRINE 1 %-1:100000 IJ SOLN
INTRAMUSCULAR | Status: DC | PRN
  Administered 2023-09-24: 2 mL via INTRADERMAL

## 2023-09-24 MED ORDER — CLINDAMYCIN PHOSPHATE 900 MG/50ML IV SOLN
INTRAVENOUS | Status: AC
  Filled 2023-09-24: qty 50

## 2023-09-24 MED ORDER — HYDROMORPHONE HCL 1 MG/ML IJ SOLN: 0.2500 mg | INTRAMUSCULAR | Status: DC | PRN

## 2023-09-24 MED ORDER — DEXAMETHASONE SODIUM PHOSPHATE 10 MG/ML IJ SOLN
INTRAMUSCULAR | Status: DC | PRN
  Administered 2023-09-24: 8 mg via INTRAVENOUS

## 2023-09-24 MED ORDER — MIDAZOLAM HCL 2 MG/2ML IJ SOLN
INTRAMUSCULAR | Status: AC
Start: 2023-09-24 — End: ?
  Filled 2023-09-24: qty 2

## 2023-09-24 MED ORDER — AMISULPRIDE (ANTIEMETIC) 5 MG/2ML IV SOLN: 10.0000 mg | Freq: Once | INTRAVENOUS | Status: DC | PRN

## 2023-09-24 MED ORDER — MIDAZOLAM HCL 5 MG/5ML IJ SOLN
INTRAMUSCULAR | Status: DC | PRN
  Administered 2023-09-24: 1 mg via INTRAVENOUS

## 2023-09-24 MED ORDER — OXYCODONE HCL 5 MG/5ML PO SOLN: 5.0000 mg | Freq: Once | ORAL | Status: DC | PRN

## 2023-09-24 MED ORDER — ONDANSETRON HCL 4 MG/2ML IJ SOLN
INTRAMUSCULAR | Status: DC | PRN
  Administered 2023-09-24: 4 mg via INTRAVENOUS

## 2023-09-24 MED ORDER — CLINDAMYCIN PHOSPHATE 900 MG/50ML IV SOLN
900.0000 mg | INTRAVENOUS | Status: AC
  Administered 2023-09-24: 900 mg via INTRAVENOUS

## 2023-09-24 MED ORDER — DEXAMETHASONE SODIUM PHOSPHATE 10 MG/ML IJ SOLN
INTRAMUSCULAR | Status: AC
  Filled 2023-09-24: qty 1

## 2023-09-24 MED ORDER — FENTANYL CITRATE (PF) 100 MCG/2ML IJ SOLN
INTRAMUSCULAR | Status: DC | PRN
  Administered 2023-09-24 (×2): 50 ug via INTRAVENOUS

## 2023-09-24 MED ORDER — BSS IO SOLN
INTRAOCULAR | Status: DC | PRN
  Administered 2023-09-24: 15 mL

## 2023-09-24 MED ORDER — LACTATED RINGERS IV SOLN: INTRAVENOUS | Status: DC

## 2023-09-24 MED ORDER — LIDOCAINE 2% (20 MG/ML) 5 ML SYRINGE
INTRAMUSCULAR | Status: DC | PRN
  Administered 2023-09-24: 60 mg via INTRAVENOUS

## 2023-09-24 MED ORDER — PHENYLEPHRINE 80 MCG/ML (10ML) SYRINGE FOR IV PUSH (FOR BLOOD PRESSURE SUPPORT)
PREFILLED_SYRINGE | INTRAVENOUS | Status: DC | PRN
  Administered 2023-09-24 (×2): 80 ug via INTRAVENOUS
  Administered 2023-09-24 (×2): 160 ug via INTRAVENOUS
  Administered 2023-09-24: 80 ug via INTRAVENOUS
  Administered 2023-09-24: 160 ug via INTRAVENOUS
  Administered 2023-09-24 (×2): 80 ug via INTRAVENOUS

## 2023-09-24 MED ORDER — OXYCODONE HCL 5 MG PO TABS: 5.0000 mg | ORAL_TABLET | Freq: Once | ORAL | Status: DC | PRN

## 2023-09-24 MED ORDER — SODIUM CHLORIDE 0.9 % IV SOLN: 12.5000 mg | INTRAVENOUS | Status: DC | PRN

## 2023-09-24 MED ORDER — FENTANYL CITRATE (PF) 100 MCG/2ML IJ SOLN
INTRAMUSCULAR | Status: AC
  Filled 2023-09-24: qty 2

## 2023-09-24 MED ORDER — PHENYLEPHRINE 80 MCG/ML (10ML) SYRINGE FOR IV PUSH (FOR BLOOD PRESSURE SUPPORT)
PREFILLED_SYRINGE | INTRAVENOUS | Status: AC
  Filled 2023-09-24: qty 20

## 2023-09-24 MED ORDER — LIDOCAINE 2% (20 MG/ML) 5 ML SYRINGE
INTRAMUSCULAR | Status: AC
  Filled 2023-09-24: qty 5

## 2023-09-24 MED ORDER — PROPOFOL 10 MG/ML IV BOLUS
INTRAVENOUS | Status: DC | PRN
  Administered 2023-09-24: 150 mg via INTRAVENOUS

## 2023-09-24 MED ORDER — BACITRACIN ZINC 500 UNIT/GM EX OINT
TOPICAL_OINTMENT | CUTANEOUS | Status: DC | PRN
  Administered 2023-09-24: 1 via TOPICAL

## 2023-09-24 MED ORDER — ONDANSETRON HCL 4 MG/2ML IJ SOLN
INTRAMUSCULAR | Status: AC
  Filled 2023-09-24: qty 2

## 2023-09-24 SURGICAL SUPPLY — 75 items
APPLICATOR COTTON TIP 6 STRL (MISCELLANEOUS) IMPLANT
APPLICATOR COTTON TIP 6IN STRL (MISCELLANEOUS) IMPLANT
APPLICATOR DR MATTHEWS STRL (MISCELLANEOUS) IMPLANT
BLADE SURG 15 STRL LF DISP TIS (BLADE) ×2 IMPLANT
CANISTER SUCT 1200ML W/VALVE (MISCELLANEOUS) ×1 IMPLANT
COAGULATOR SUCT 8FR VV (MISCELLANEOUS) IMPLANT
CORD BIPOLAR FORCEPS 12FT (ELECTRODE) IMPLANT
COTTONBALL LRG STERILE PKG (GAUZE/BANDAGES/DRESSINGS) IMPLANT
DRESSING NASAL POPE 10X1.5X2.5 (GAUZE/BANDAGES/DRESSINGS) ×1 IMPLANT
DRSG NASAL POPE 10X1.5X2.5 (GAUZE/BANDAGES/DRESSINGS) IMPLANT
DRSG TEGADERM 2-3/8X2-3/4 SM (GAUZE/BANDAGES/DRESSINGS) ×1 IMPLANT
DRSG TEGADERM 4X4.75 (GAUZE/BANDAGES/DRESSINGS) IMPLANT
DRSG TELFA 3X8 NADH STRL (GAUZE/BANDAGES/DRESSINGS) ×1 IMPLANT
ELECT COATED BLADE 2.86 ST (ELECTRODE) IMPLANT
ELECT NDL BLADE 2-5/6 (NEEDLE) IMPLANT
ELECT NEEDLE BLADE 2-5/6 (NEEDLE) ×1 IMPLANT
ELECT REM PT RETURN 9FT ADLT (ELECTROSURGICAL) ×1 IMPLANT
ELECTRODE REM PT RTRN 9FT ADLT (ELECTROSURGICAL) ×1 IMPLANT
GAUZE SPONGE 2X2 STRL 8-PLY (GAUZE/BANDAGES/DRESSINGS) IMPLANT
GAUZE VASELINE FOILPK 1/2 X 72 (GAUZE/BANDAGES/DRESSINGS) IMPLANT
GLOVE BIO SURGEON STRL SZ7.5 (GLOVE) ×1 IMPLANT
GLOVE BIOGEL PI IND STRL 7.0 (GLOVE) IMPLANT
GLOVE BIOGEL PI IND STRL 7.5 (GLOVE) IMPLANT
GLOVE BIOGEL PI IND STRL 8 (GLOVE) ×1 IMPLANT
GLOVE SURG SS PI 7.0 STRL IVOR (GLOVE) IMPLANT
GOWN STRL REUS W/ TWL LRG LVL3 (GOWN DISPOSABLE) ×1 IMPLANT
GOWN STRL REUS W/ TWL XL LVL3 (GOWN DISPOSABLE) ×1 IMPLANT
HEMOSTAT SURGICEL .5X2 ABSORB (HEMOSTASIS) IMPLANT
MARKER SKIN DUAL TIP RULER LAB (MISCELLANEOUS) IMPLANT
NDL PRECISIONGLIDE 27X1.5 (NEEDLE) ×1 IMPLANT
NEEDLE PRECISIONGLIDE 27X1.5 (NEEDLE) ×1 IMPLANT
NS IRRIG 1000ML POUR BTL (IV SOLUTION) ×1 IMPLANT
PACK BASIN DAY SURGERY FS (CUSTOM PROCEDURE TRAY) ×1 IMPLANT
PACK ENT DAY SURGERY (CUSTOM PROCEDURE TRAY) ×1 IMPLANT
PENCIL SMOKE EVACUATOR (MISCELLANEOUS) ×1 IMPLANT
SHEET MEDIUM DRAPE 40X70 STRL (DRAPES) IMPLANT
SHEILD EYE MED CORNL SHD 22X21 (OPHTHALMIC RELATED) IMPLANT
SHIELD EYE MED CORNL SHD 22X21 (OPHTHALMIC RELATED) IMPLANT
SLEEVE SCD COMPRESS KNEE MED (STOCKING) ×1 IMPLANT
SPIKE FLUID TRANSFER (MISCELLANEOUS) IMPLANT
SPLINT NASAL DENVER LRG BLUSH (MISCELLANEOUS) IMPLANT
SPLINT NASAL DENVER SM/MD BLUS (MISCELLANEOUS) IMPLANT
SPONGE SURGIFOAM ABS GEL 12-7 (HEMOSTASIS) IMPLANT
SPONGE T-LAP 18X18 ~~LOC~~+RFID (SPONGE) IMPLANT
STAPLER SKIN PROX WIDE 3.9 (STAPLE) IMPLANT
SUCTION TUBE FRAZIER 10FR DISP (SUCTIONS) IMPLANT
SUT CHROMIC 5 0 P 3 (SUTURE) IMPLANT
SUT ETHILON 5 0 PS 2 18 (SUTURE) IMPLANT
SUT ETHILON 6 0 P 1 (SUTURE) IMPLANT
SUT ETHILON 6 0 PS 3 18 (SUTURE) IMPLANT
SUT MNCRL 6-0 UNDY P1 1X18 (SUTURE) IMPLANT
SUT MNCRL AB 4-0 PS2 18 (SUTURE) IMPLANT
SUT MON AB 4-0 PC3 18 (SUTURE) IMPLANT
SUT MON AB 5-0 P3 18 (SUTURE) IMPLANT
SUT PDS AB 4-0 P3 18 (SUTURE) IMPLANT
SUT PDS II 5-0 VIOLET 1X18 TF (SUTURE) IMPLANT
SUT PLAIN 6 0 TG1408 (SUTURE) IMPLANT
SUT PLAIN GUT FAST 5-0 (SUTURE) IMPLANT
SUT SILK 3 0 PS 1 (SUTURE) IMPLANT
SUT SILK 3 0 SH CR/8 (SUTURE) IMPLANT
SUT SILK 4 0 PS 2 (SUTURE) IMPLANT
SUT VIC AB 3-0 PS2 18XBRD (SUTURE) IMPLANT
SUT VIC AB 3-0 X1 27 (SUTURE) IMPLANT
SUT VIC AB 4-0 PS2 18 (SUTURE) IMPLANT
SUT VIC AB 4-0 PS2 27 (SUTURE) IMPLANT
SUT VICRYL RAPID 5 0 P 3 (SUTURE) IMPLANT
SUT VICRYL RAPIDE 4-0 (SUTURE) IMPLANT
SYR 10ML LL (SYRINGE) IMPLANT
SYR 3ML 23GX1 SAFETY (SYRINGE) ×1 IMPLANT
SYR BULB EAR ULCER 3OZ GRN STR (SYRINGE) IMPLANT
TAPE PAPER 1/2X10 TAN MEDIPORE (MISCELLANEOUS) IMPLANT
TOWEL GREEN STERILE FF (TOWEL DISPOSABLE) ×2 IMPLANT
TRAY DSU PREP LF (CUSTOM PROCEDURE TRAY) ×1 IMPLANT
TUBE SALEM SUMP 16F (TUBING) IMPLANT
YANKAUER SUCT BULB TIP NO VENT (SUCTIONS) IMPLANT

## 2023-09-24 NOTE — Op Note (Signed)
 FACIAL PLASTIC SURGERY OPERATIVE NOTE  Peggy Graves Date/Time of Admission: 09/24/2023  8:51 AM  CSN: [id number];MRN:[id number] Attending Provider: Scarlette Ar, MD Room/Bed: [facility]/NONE DOB: 01-18-1941 Age: 83 y.o.   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 No exposed cartilage   Description of Operation: The patient was identified in the preoperative area and consent confirmed in the chart.  She was brought to the operating room by the anesthetist and a preoperative huddle was performed confirming the patient's identity and procedure to be performed.  Once all were in agreement we proceeded with surgery.  General anesthesia was induced and the patient was intubated with a laryngeal mask airway.  This was secured.  The patient's bandages were removed demonstrating the findings as noted above. The cheek and nose were anesthetized with lidocaine and epinephrine.   The patient was prepped and draped in standard fashion for procedure of this kind.  Final preoperative pause was performed and we proceeded with surgery.   We began by clamping the nasolabial flap interpolated pedicle with a hemostat.  There was adequate perfusion and good capillary refill of the forehead flap despite clamping the pedicle.  Given this the nasolabial flap pedicle was divided  sharply with a 15 blade.  Double-pronged skin hooks were used to sharply incise the nasolabial flap proximal stump at the cheek creating a subcutaneous plane for closure.  The subcutaneous tissue and granulation tissues were discarded.  A small V wedge was created in the cheek donor site scar closure site to allow for inset of the cheek flap.  This was closed with buried interrupted 5-0 Monocryl and interrupted 6-0 nylon for an aesthetic closure.   Next we proceeded with insetting the nasolabial flap along the dorsum of the nasal Mohs defect.  Again a 15 blade and appropriate tension with double-pronged skin hooks were used to incise the flaps of the flap laterally removing the excess subcutaneous tissue.  The recipient site was sharply incised to create fresh edges for closure.  Next the nasolabial flap was adequately trimmed and contoured to the recipient site and closed with buried interrupted 5-0 Monocryl and interrupted 6-0 nylon sutures.    The wounds were dressed with bacitracin, telfa, and brown paper tape.   The procedures were completed without complication and tolerated well.     Mervin Kung, MD [facility]  09/24/2023

## 2023-09-24 NOTE — H&P (Signed)
 Peggy Graves is an 83 y.o. female.    Chief Complaint:  Acquired nasal deformity  HPI: Patient presents today for planned elective procedure.  He/she denies any interval change in history since office visit on 09/08/23.  Past Medical History:  Diagnosis Date   Acid reflux    Bell's palsy    Cancer (HCC)    Chronic pain syndrome    Fibromyalgia    Heart murmur    History of hiatal hernia    Hyperlipidemia    Hypothyroidism     Past Surgical History:  Procedure Laterality Date   APPENDECTOMY     CHOLECYSTECTOMY     DILATION AND CURETTAGE OF UTERUS     multiple   ESOPHAGOGASTRODUODENOSCOPY (EGD) WITH PROPOFOL N/A 11/13/2022   Procedure: ESOPHAGOGASTRODUODENOSCOPY (EGD) WITH PROPOFOL;  Surgeon: Corbin Ade, MD;  [facility];  Service: Endoscopy;  Laterality: N/A;  12:30 pm, ASA 3   EXCISION MASS HEAD Left 08/20/2023   Procedure: EXCISION AND REPAIR OF MOHS DEFECT ON LEFT NOSE, WITH 4x1.2cm INTERPOLATED PEDICLED NASOLABIAL FLAP;  Surgeon: Scarlette Ar, MD;  Location: [facility];  Service: ENT;  Laterality: Left;   hysterectomy     partial and then completed   MALONEY DILATION N/A 11/13/2022   Procedure: MALONEY DILATION;  Surgeon: Corbin Ade, MD;  [facility];  Service: Endoscopy;  Laterality: N/A;   MICROLARYNGOSCOPY WITH CO2 LASER AND EXCISION OF VOCAL CORD LESION N/A 07/23/2023   Procedure: MICROLARYNGOSCOPY WITH CO2 LASER AND EXCISION OF VOCAL CORD LESION;  Surgeon: Ashok Croon, MD;  Location: [facility];  Service: ENT;  Laterality: N/A;   NASAL FLAP ROTATION Left 08/20/2023   Procedure: NASOLABIAL FLAP;  Surgeon: Scarlette Ar, MD;  Location: [facility] OR;  Service: ENT;  Laterality: Left;   RIGID BRONCHOSCOPY N/A 07/23/2023   Procedure: RIGID BRONCHOSCOPY;  Surgeon: Ashok Croon, MD;  Location: [facility];  Service: ENT;  Laterality: N/A;   SKIN FULL THICKNESS GRAFT Left 08/20/2023   Procedure: AURICULAR CARTILAGE HARVEST 20x67mm;  Surgeon: Scarlette Ar,  MD;  [facility]: [facility] OR;  Service: ENT;  Laterality: Left;   TONSILLECTOMY      Family History  Problem Relation Age of Onset   Colon cancer Neg Hx     Social History:  reports that she quit smoking about 5 years ago. Her smoking use included cigarettes. She has never used smokeless tobacco. She reports that she does not currently use alcohol. She reports that she does not use drugs.  Allergies:  Allergies  Allergen Reactions   Bee Venom Anaphylaxis   Nicorette [Nicotine Polacrilex] Anaphylaxis   Penicillin G Anaphylaxis   Sulfa Antibiotics Anaphylaxis   Other     Blood product refusal    Medications Prior to Admission  Medication Sig Dispense Refill   amLODipine (NORVASC) 2.5 MG tablet Take 2.5 mg by mouth daily.     aspirin 325 MG tablet Take 325 mg by mouth every other day. In the morning     Cholecalciferol (VITAMIN D) 50 MCG (2000 UT) tablet Take 2,000 Units by mouth in the morning.     Cyanocobalamin (B-12 PO) Take 2,000 mcg by mouth in the morning.     EPINEPHrine (EPIPEN 2-PAK) 0.3 mg/0.3 mL IJ SOAJ injection Inject 0.3 mg into the muscle as needed for anaphylaxis.     ezetimibe (ZETIA) 10 MG tablet Take 10 mg by mouth in the morning.     HYDROcodone-acetaminophen (NORCO/VICODIN) 5-325 MG tablet Take 1 tablet by  mouth every 6 (six) hours as needed for moderate pain.     levothyroxine (SYNTHROID) 100 MCG tablet Take 100 mcg by mouth daily before breakfast.     magnesium oxide (MAG-OX) 400 (240 Mg) MG tablet Take 400 mg by mouth in the morning.     meloxicam (MOBIC) 7.5 MG tablet Take 7.5 mg by mouth daily.     omeprazole (PRILOSEC) 40 MG capsule Take 1 capsule (40 mg total) by mouth daily. 30 capsule 11   OVER THE COUNTER MEDICATION Take 1 capsule by mouth in the morning.  del-Immune V Defense All-Natural Advanced Immune Support     oxybutynin (DITROPAN) 5 MG tablet Take 5 mg by mouth in the morning.      No results found for this or any previous visit (from the past 48  hours). No results found.  ROS: negative other than stated in HPI  Height 5\' 2"  (1.575 m), weight 74 kg.  PHYSICAL EXAM: General: Resting comfortably in NAD  Lungs: Non-labored respiratinos  Studies Reviewed: none   Assessment/Plan Acquired nasal deformity Mohs defect of nose BCC skin of nose  Proceed with division and inset of nasolabial flap. Informed consent obtained. RBA discussed.     Electronically signed by:  Scarlette Ar, MD  Staff Physician Facial Plastic & Reconstructive Surgery Otolaryngology - Head and Neck Surgery [facility] - [facility]  09/24/2023, 8:59 AM

## 2023-09-24 NOTE — Anesthesia Postprocedure Evaluation (Signed)
 Anesthesia Post Note  Patient: Peggy Graves  Procedure(s) Performed: DIVISION AND INSET LEFT NOSE NASOLABIAL FLAP (Left: Nose)     Patient location during evaluation: PACU Anesthesia Type: General Level of consciousness: awake and alert Pain management: pain level controlled Vital Signs Assessment: post-procedure vital signs reviewed and stable Respiratory status: spontaneous breathing, nonlabored ventilation and respiratory function stable Cardiovascular status: blood pressure returned to baseline and stable Postop Assessment: no apparent nausea or vomiting Anesthetic complications: no   No notable events documented.  Last Vitals:  Vitals:   09/24/23 1125 09/24/23 1141  BP: (!) 140/50 (!) 159/50  Pulse:  74  Resp:  20  Temp:  (!) 36.3 C  SpO2:  94%    Last Pain:  Vitals:   09/24/23 1141  TempSrc: Temporal  PainSc: 0-No pain                 Lowella Curb

## 2023-09-24 NOTE — Discharge Instructions (Signed)

## 2023-09-24 NOTE — Transfer of Care (Signed)
 Immediate Anesthesia Transfer of Care Note  Patient: Peggy Graves  Procedure(s) Performed: DIVISION AND INSET LEFT NOSE NASOLABIAL FLAP (Left: Nose)  Patient Location: PACU  Anesthesia Type:General  Level of Consciousness: awake and patient cooperative  Airway & Oxygen Therapy: Patient Spontanous Breathing and Patient connected to face mask oxygen  Post-op Assessment: Report given to RN and Post -op Vital signs reviewed and stable  Post vital signs: Reviewed and stable  Last Vitals:  Vitals Value Taken Time  BP 125/109 09/24/23 1110  Temp    Pulse 77 09/24/23 1113  Resp 22 09/24/23 1113  SpO2 95 % 09/24/23 1113  Vitals shown include unfiled device data.  Last Pain:  Vitals:   09/24/23 0930  TempSrc: Temporal  PainSc: 0-No pain         Complications: No notable events documented.

## 2023-09-24 NOTE — Anesthesia Procedure Notes (Signed)
 Procedure Name: LMA Insertion Date/Time: 09/24/2023 10:11 AM  Performed by: Yolanda Bonine, CRNAPre-anesthesia Checklist: Patient identified, Emergency Drugs available, Suction available, Patient being monitored and Timeout performed Patient Re-evaluated:Patient Re-evaluated prior to induction Oxygen Delivery Method: Circle system utilized Preoxygenation: Pre-oxygenation with 100% oxygen Induction Type: IV induction Ventilation: Mask ventilation without difficulty LMA: LMA with gastric port inserted LMA Size: 4.0 Number of attempts: 1 Placement Confirmation: positive ETCO2 Tube secured with: Tape Dental Injury: Teeth and Oropharynx as per pre-operative assessment

## 2023-09-24 NOTE — Anesthesia Preprocedure Evaluation (Signed)
 Anesthesia Evaluation  Patient identified by MRN, date of birth, ID band Patient awake    Reviewed: Allergy & Precautions, NPO status , Patient's Chart, lab work & pertinent test results  History of Anesthesia Complications Negative for: history of anesthetic complications  Airway Mallampati: I  TM Distance: >3 FB Neck ROM: Full    Dental  (+) Edentulous Upper, Edentulous Lower   Pulmonary former smoker   breath sounds clear to auscultation       Cardiovascular hypertension, Pt. on medications (-) angina  Rhythm:Regular Rate:Normal  08/2022 ECHO:  The left ventricular size is normal.  Left ventricular systolic function is normal.  LV ejection fraction = 60-65%.  The right ventricle is normal in size and function.  Injection of agitated saline showed no right-to-left shunt.  There is aortic valve sclerosis, without stenosis.  There is mild to moderate aortic regurgitation.     Neuro/Psych H/o Bell's palsy    GI/Hepatic Neg liver ROS, hiatal hernia,GERD  Medicated and Controlled,,  Endo/Other  Hypothyroidism  BMI 29.8  Renal/GU negative Renal ROS     Musculoskeletal  (+)  Fibromyalgia -  Abdominal   Peds  Hematology negative hematology ROS (+)   Anesthesia Other Findings   Reproductive/Obstetrics                             Anesthesia Physical Anesthesia Plan  ASA: 3  Anesthesia Plan: General   Post-op Pain Management: Minimal or no pain anticipated   Induction: Intravenous  PONV Risk Score and Plan: 3 and Ondansetron, Dexamethasone, Treatment may vary due to age or medical condition and Midazolam  Airway Management Planned: LMA  Additional Equipment: None  Intra-op Plan:   Post-operative Plan: Extubation in OR  Informed Consent: I have reviewed the patients History and Physical, chart, labs and discussed the procedure including the risks, benefits and alternatives for  the proposed anesthesia with the patient or authorized representative who has indicated his/her understanding and acceptance.     Dental advisory given  Plan Discussed with: CRNA and Surgeon  Anesthesia Plan Comments:         Anesthesia Quick Evaluation

## 2023-09-25 ENCOUNTER — Encounter (HOSPITAL_BASED_OUTPATIENT_CLINIC_OR_DEPARTMENT_OTHER): Payer: Self-pay | Admitting: Otolaryngology

## 2023-11-17 ENCOUNTER — Other Ambulatory Visit: Payer: Self-pay | Admitting: Internal Medicine

## 2023-11-18 NOTE — Telephone Encounter (Signed)
Needs ov for further refills. Limited refills provided

## 2023-11-26 NOTE — Telephone Encounter (Signed)
 Contacted pt on 11/26/23 about any future refills pt will need a OV first. Pt states her primary doctor will refill this one prescription. Pt states she will not need a appt with us  at this time.

## 2024-02-13 ENCOUNTER — Other Ambulatory Visit: Payer: Self-pay | Admitting: Gastroenterology
